# Patient Record
Sex: Female | Born: 1980 | Hispanic: Yes | Marital: Married | Smoking: Former smoker
Health system: Southern US, Community
[De-identification: ages and names within clinical notes are randomized; demographics above are authoritative.]

## PROBLEM LIST (undated history)

## (undated) ENCOUNTER — Inpatient Hospital Stay (HOSPITAL_COMMUNITY): Payer: Self-pay

## (undated) DIAGNOSIS — M26629 Arthralgia of temporomandibular joint, unspecified side: Secondary | ICD-10-CM

## (undated) DIAGNOSIS — K8020 Calculus of gallbladder without cholecystitis without obstruction: Secondary | ICD-10-CM

## (undated) HISTORY — DX: Calculus of gallbladder without cholecystitis without obstruction: K80.20

## (undated) HISTORY — DX: Arthralgia of temporomandibular joint, unspecified side: M26.629

## (undated) HISTORY — PX: PELVIC LAPAROSCOPY: SHX162

---

## 2003-03-16 ENCOUNTER — Emergency Department (HOSPITAL_COMMUNITY): Admission: EM | Admit: 2003-03-16 | Discharge: 2003-03-16 | Payer: Self-pay

## 2003-07-30 ENCOUNTER — Emergency Department (HOSPITAL_COMMUNITY): Admission: EM | Admit: 2003-07-30 | Discharge: 2003-07-31 | Payer: Self-pay | Admitting: Emergency Medicine

## 2003-07-31 ENCOUNTER — Encounter: Payer: Self-pay | Admitting: Emergency Medicine

## 2003-11-09 ENCOUNTER — Other Ambulatory Visit: Admission: RE | Admit: 2003-11-09 | Discharge: 2003-11-09 | Payer: Self-pay | Admitting: Obstetrics and Gynecology

## 2004-09-21 ENCOUNTER — Other Ambulatory Visit: Admission: RE | Admit: 2004-09-21 | Discharge: 2004-09-21 | Payer: Self-pay | Admitting: Obstetrics and Gynecology

## 2006-02-26 ENCOUNTER — Other Ambulatory Visit: Admission: RE | Admit: 2006-02-26 | Discharge: 2006-02-26 | Payer: Self-pay | Admitting: Obstetrics & Gynecology

## 2007-02-05 ENCOUNTER — Other Ambulatory Visit: Admission: RE | Admit: 2007-02-05 | Discharge: 2007-02-05 | Payer: Self-pay | Admitting: Gynecology

## 2007-05-20 ENCOUNTER — Ambulatory Visit (HOSPITAL_COMMUNITY): Admission: RE | Admit: 2007-05-20 | Discharge: 2007-05-20 | Payer: Self-pay | Admitting: Gynecology

## 2010-12-31 ENCOUNTER — Encounter: Payer: Self-pay | Admitting: Gynecology

## 2011-12-24 ENCOUNTER — Encounter (HOSPITAL_COMMUNITY): Payer: Self-pay | Admitting: Emergency Medicine

## 2011-12-24 ENCOUNTER — Emergency Department (HOSPITAL_COMMUNITY)
Admission: EM | Admit: 2011-12-24 | Discharge: 2011-12-25 | Disposition: A | Discharge status: Home / Self Care | Payer: 59 | Attending: Emergency Medicine | Admitting: Emergency Medicine

## 2011-12-24 ENCOUNTER — Emergency Department (HOSPITAL_COMMUNITY)
Admission: EM | Admit: 2011-12-24 | Discharge: 2011-12-24 | Disposition: A | Discharge status: Home / Self Care | Payer: Self-pay | Source: Home / Self Care | Attending: Family Medicine | Admitting: Family Medicine

## 2011-12-24 DIAGNOSIS — R110 Nausea: Secondary | ICD-10-CM | POA: Insufficient documentation

## 2011-12-24 DIAGNOSIS — R1011 Right upper quadrant pain: Secondary | ICD-10-CM

## 2011-12-24 DIAGNOSIS — K8020 Calculus of gallbladder without cholecystitis without obstruction: Secondary | ICD-10-CM | POA: Insufficient documentation

## 2011-12-24 DIAGNOSIS — R109 Unspecified abdominal pain: Secondary | ICD-10-CM | POA: Insufficient documentation

## 2011-12-24 LAB — URINE MICROSCOPIC-ADD ON

## 2011-12-24 LAB — CBC
HCT: 35.6 % — ABNORMAL LOW (ref 36.0–46.0)
MCH: 30.5 pg (ref 26.0–34.0)
MCHC: 35.1 g/dL (ref 30.0–36.0)
RBC: 4.1 MIL/uL (ref 3.87–5.11)
WBC: 8.5 10*3/uL (ref 4.0–10.5)

## 2011-12-24 LAB — URINALYSIS, ROUTINE W REFLEX MICROSCOPIC
Bilirubin Urine: NEGATIVE
Glucose, UA: NEGATIVE mg/dL
Ketones, ur: NEGATIVE mg/dL
Urobilinogen, UA: 0.2 mg/dL (ref 0.0–1.0)

## 2011-12-24 LAB — COMPREHENSIVE METABOLIC PANEL
BUN: 9 mg/dL (ref 6–23)
Calcium: 10 mg/dL (ref 8.4–10.5)
Chloride: 99 mEq/L (ref 96–112)
Creatinine, Ser: 0.56 mg/dL (ref 0.50–1.10)
Sodium: 137 mEq/L (ref 135–145)
Total Bilirubin: 0.2 mg/dL — ABNORMAL LOW (ref 0.3–1.2)

## 2011-12-24 LAB — DIFFERENTIAL
Basophils Absolute: 0 10*3/uL (ref 0.0–0.1)
Basophils Relative: 0 % (ref 0–1)
Eosinophils Absolute: 0.1 10*3/uL (ref 0.0–0.7)
Lymphocytes Relative: 33 % (ref 12–46)
Monocytes Relative: 5 % (ref 3–12)
Neutro Abs: 5.1 10*3/uL (ref 1.7–7.7)

## 2011-12-24 LAB — POCT PREGNANCY, URINE: Preg Test, Ur: NEGATIVE

## 2011-12-24 NOTE — ED Provider Notes (Signed)
History     CSN: [id number]  Arrival date & time 12/24/11  1534   First MD Initiated Contact with Patient 12/24/11 1649      Chief Complaint  Patient presents with  . Abdominal Pain    (Consider location/radiation/quality/duration/timing/severity/associated sxs/prior treatment) HPI Comments: Patient reports RUQ pain that began last night 1 hour after eating.  Associated nausea.  Pain lasted throughout the night, subsided slightly this morning then returned 30 minutes after eating lunch.  Associated nausea.  Patient denies fevers, vomiting, change in bowel habits, urinary symptoms, abnormal vaginal bleeding.  LMP Dec 17.    The history is provided by the patient.    History reviewed. No pertinent past medical history.  History reviewed. No pertinent past surgical history.  No family history on file.  History  Substance Use Topics  . Smoking status: Never Smoker   . Smokeless tobacco: Not on file  . Alcohol Use: No    OB History    Grav Para Term Preterm Abortions TAB SAB Ect Mult Living                  Review of Systems  Constitutional: Negative for fever.  Respiratory: Positive for shortness of breath.   Cardiovascular: Negative for chest pain.  All other systems reviewed and are negative.    Allergies  Review of patient's allergies indicates no known allergies.  Home Medications  No current outpatient prescriptions on file.  BP 129/86  Pulse 99  Temp(Src) 98.3 F (36.8 C) (Oral)  Resp 16  SpO2 98%  Physical Exam  Nursing note and vitals reviewed. Constitutional: She is oriented to person, place, and time. She appears well-developed and well-nourished.  HENT:  Head: Normocephalic and atraumatic.  Neck: Neck supple.  Cardiovascular: Normal rate, regular rhythm and normal heart sounds.   Pulmonary/Chest: Breath sounds normal. No respiratory distress. She has no wheezes. She has no rales. She exhibits no tenderness.  Abdominal: Soft. Bowel sounds are  normal. She exhibits no distension. There is tenderness in the right upper quadrant. There is no rebound and no guarding.       Patient moderately tender in RUQ.    Neurological: She is alert and oriented to person, place, and time.    ED Course  Procedures (including critical care time)  Labs Reviewed - No data to display No results found.   1. Abdominal pain, RUQ       MDM  Patient with RUQ pain, nausea.  No fevers, no vomiting.  Patient with significant tenderness in RUQ.  Likely biliary colic but concern for cholecystitis.  Transfer to ED for further evaluation.         Dillard Cannon [place], [place] 12/24/11 [phone]

## 2011-12-24 NOTE — ED Notes (Signed)
PT. REPORTS RUQ PAIN ONSET LAST NIGHT AFTER EATING , SEEN AT [facility] - SENT HERE FOR FURTHER EVALUATION ,  SLIGHT NAUSEA . NO VOMITTING OR DIARRHEA, SLIGHT CHILLS .

## 2011-12-24 NOTE — ED Provider Notes (Signed)
History     CSN: [id number]  Arrival date & time 12/24/11  1801   First MD Initiated Contact with Patient 12/24/11 2149      Chief Complaint  Patient presents with  . Abdominal Pain    (Consider location/radiation/quality/duration/timing/severity/associated sxs/prior treatment) Patient is a 31 y.o. female presenting with abdominal pain.  Abdominal Pain The primary symptoms of the illness include abdominal pain and nausea.   Complains of epigastric pain radiating to bilateral upper quadrants onset one hour after eating yesterday. Pain lasts approximately an hour with severe resolved without treatment. Pain recurred this afternoon at approximately 1 PM after eating lasagna for lunch. Symptoms are accompanied by nausea no vomiting no fever no other complaint. No other associated symptoms pain described as sharp and severe. No treatment prior to coming here. Seen at [facility] prior to coming here sent here for further evaluation. Patient asymptomatic as I examine her  History reviewed. No pertinent past medical history. Past medical history neck History reviewed. No pertinent past surgical history.  No family history on file. Vertical history negative History  Substance Use Topics  . Smoking status: Never Smoker   . Smokeless tobacco: Not on file  . Alcohol Use: No   no alcohol no illicit drug  OB History    Grav Para Term Preterm Abortions TAB SAB Ect Mult Living                  Review of Systems  Constitutional: Negative.   HENT: Negative.   Respiratory: Negative.   Cardiovascular: Negative.   Gastrointestinal: Positive for nausea and abdominal pain.  Musculoskeletal: Negative.   Skin: Negative.   Neurological: Negative.   Hematological: Negative.   Psychiatric/Behavioral: Negative.     Allergies  Review of patient's allergies indicates no known allergies.  Home Medications  No current outpatient prescriptions on file.  BP 128/90  Pulse 95   Temp(Src) 98.2 F (36.8 C) (Oral)  Resp 16  SpO2 98%  LMP 11/26/2011  Physical Exam  Nursing note and vitals reviewed. Constitutional: She appears well-developed and well-nourished.  HENT:  Head: Normocephalic and atraumatic.  Eyes: Conjunctivae are normal. Pupils are equal, round, and reactive to light.  Neck: Neck supple. No tracheal deviation present. No thyromegaly present.  Cardiovascular: Normal rate and regular rhythm.   No murmur heard. Pulmonary/Chest: Effort normal and breath sounds normal.  Abdominal: Soft. Bowel sounds are normal. She exhibits no distension. There is tenderness.       Tenderness right upper quadrant. Positive Murphy sign  Musculoskeletal: Normal range of motion. She exhibits no edema and no tenderness.  Neurological: She is alert. Coordination normal.  Skin: Skin is warm and dry. No rash noted.  Psychiatric: She has a normal mood and affect.    ED Course  Procedures (including critical care time)  Labs Reviewed  CBC - Abnormal; Notable for the following:    HCT 35.6 (*)    All other components within normal limits  COMPREHENSIVE METABOLIC PANEL - Abnormal; Notable for the following:    AST 99 (*)    ALT 154 (*)    Total Bilirubin 0.2 (*)    All other components within normal limits  URINALYSIS, ROUTINE W REFLEX MICROSCOPIC - Abnormal; Notable for the following:    APPearance CLOUDY (*)    Hgb urine dipstick SMALL (*)    All other components within normal limits  DIFFERENTIAL  POCT PREGNANCY, URINE  POCT PREGNANCY, URINE  URINE MICROSCOPIC-ADD  ON   Results for orders placed during the hospital encounter of 12/24/11  CBC      Component Value Range   WBC 8.5  4.0 - 10.5 (K/uL)   RBC 4.10  3.87 - 5.11 (MIL/uL)   Hemoglobin 12.5  12.0 - 15.0 (g/dL)   HCT 16.1 (*) 09.6 - 46.0 (%)   MCV 86.8  78.0 - 100.0 (fL)   MCH 30.5  26.0 - 34.0 (pg)   MCHC 35.1  30.0 - 36.0 (g/dL)   RDW 04.5  40.9 - 81.1 (%)   Platelets 322  150 - 400 (K/uL)    DIFFERENTIAL      Component Value Range   Neutrophils Relative 60  43 - 77 (%)   Neutro Abs 5.1  1.7 - 7.7 (K/uL)   Lymphocytes Relative 33  12 - 46 (%)   Lymphs Abs 2.8  0.7 - 4.0 (K/uL)   Monocytes Relative 5  3 - 12 (%)   Monocytes Absolute 0.4  0.1 - 1.0 (K/uL)   Eosinophils Relative 1  0 - 5 (%)   Eosinophils Absolute 0.1  0.0 - 0.7 (K/uL)   Basophils Relative 0  0 - 1 (%)   Basophils Absolute 0.0  0.0 - 0.1 (K/uL)  COMPREHENSIVE METABOLIC PANEL      Component Value Range   Sodium 137  135 - 145 (mEq/L)   Potassium 3.7  3.5 - 5.1 (mEq/L)   Chloride 99  96 - 112 (mEq/L)   CO2 27  19 - 32 (mEq/L)   Glucose, Bld 86  70 - 99 (mg/dL)   BUN 9  6 - 23 (mg/dL)   Creatinine, Ser 9.14  0.50 - 1.10 (mg/dL)   Calcium 78.2  8.4 - 10.5 (mg/dL)   Total Protein 7.4  6.0 - 8.3 (g/dL)   Albumin 4.0  3.5 - 5.2 (g/dL)   AST 99 (*) 0 - 37 (U/L)   ALT 154 (*) 0 - 35 (U/L)   Alkaline Phosphatase 84  39 - 117 (U/L)   Total Bilirubin 0.2 (*) 0.3 - 1.2 (mg/dL)   GFR calc non Af Amer >90  >90 (mL/min)   GFR calc Af Amer >90  >90 (mL/min)  URINALYSIS, ROUTINE W REFLEX MICROSCOPIC      Component Value Range   Color, Urine YELLOW  YELLOW    APPearance CLOUDY (*) CLEAR    Specific Gravity, Urine 1.025  1.005 - 1.030    pH 6.0  5.0 - 8.0    Glucose, UA NEGATIVE  NEGATIVE (mg/dL)   Hgb urine dipstick SMALL (*) NEGATIVE    Bilirubin Urine NEGATIVE  NEGATIVE    Ketones, ur NEGATIVE  NEGATIVE (mg/dL)   Protein, ur NEGATIVE  NEGATIVE (mg/dL)   Urobilinogen, UA 0.2  0.0 - 1.0 (mg/dL)   Nitrite NEGATIVE  NEGATIVE    Leukocytes, UA NEGATIVE  NEGATIVE   POCT PREGNANCY, URINE      Component Value Range   Preg Test, Ur NEGATIVE    URINE MICROSCOPIC-ADD ON      Component Value Range   Squamous Epithelial / LPF FEW (*) RARE    RBC / HPF 3-6  <3 (RBC/hpf)   Bacteria, UA FEW (*) RARE    Casts GRANULAR CAST (*) NEGATIVE   LIPASE, BLOOD      Component Value Range   Lipase 24  11 - 59 (U/L)   No  results found.  No results found.   No diagnosis found.  MDM  Patient signed out to Dr. Brooke Dare at 12 midnight To check abdominal ultrasound Suspect biliary colic or acute cholecystitis  Diagnosis :#1 abdominal pain #2 elevated lfts     Doug Sou, MD 12/25/11 [phone]

## 2011-12-24 NOTE — ED Notes (Signed)
PT HERE  WITH SUDDEN RIGHT UPPER QUAD PAIN POST EATING DINNER YESTERDAY THAT RESOLVED THEN REPEATED TODAY WITH SEVERE SHARP PAIN,NAUSEA.NO TREATMENTS TRIED.VSS

## 2011-12-24 NOTE — ED Provider Notes (Signed)
Medical screening examination/treatment/procedure(s) were performed by non-physician practitioner and as supervising physician I was immediately available for consultation/collaboration.   Lequan Dobratz DOUGLAS MD.    Rueben Kassim Douglas Jahleel Stroschein, MD 12/24/11 2102 

## 2011-12-25 ENCOUNTER — Emergency Department (HOSPITAL_COMMUNITY): Payer: 59

## 2011-12-25 MED ORDER — PROMETHAZINE HCL 25 MG PO TABS: 25.0000 mg | ORAL_TABLET | Freq: Four times a day (QID) | ORAL | Status: AC | PRN

## 2011-12-25 MED ORDER — OXYCODONE-ACETAMINOPHEN 5-325 MG PO TABS: 1.0000 | ORAL_TABLET | Freq: Four times a day (QID) | ORAL | Status: AC | PRN

## 2011-12-25 NOTE — ED Provider Notes (Signed)
Symptomatic cholelithiasis with no evidence of cholecystitis. Discussed with surgery. Instructed patient to followup in one week.  Dayton Bailiff, MD 12/25/11 [phone]

## 2012-01-08 ENCOUNTER — Encounter (INDEPENDENT_AMBULATORY_CARE_PROVIDER_SITE_OTHER): Payer: Self-pay | Admitting: Surgery

## 2012-01-08 ENCOUNTER — Ambulatory Visit (INDEPENDENT_AMBULATORY_CARE_PROVIDER_SITE_OTHER): Payer: 59 | Admitting: Surgery

## 2012-01-08 VITALS — BP 122/76 | HR 72 | Temp 97.4°F | Resp 18 | Ht 63.0 in | Wt 195.4 lb

## 2012-01-08 DIAGNOSIS — K8010 Calculus of gallbladder with chronic cholecystitis without obstruction: Secondary | ICD-10-CM | POA: Insufficient documentation

## 2012-01-08 NOTE — Progress Notes (Signed)
[name], female   DOB: 1981-10-01, 31 y.o.   MRN: [id number]  Chief Complaint  Patient presents with  . Other    Eval gallstones    HPI Becky Everett is a 31 y.o. female. Referred by Dr. Brooke Dare in ED for symptomatic gallstones    HPI 31 yo female presents with two recent episodes of severe post-prandial RUQ abdominal pain and nausea.  She continues to have some mild RUQ discomfort.  She denies any diarrhea, vomiting, or acholic stool.  She was evaluated in the emergency department on 1/14 and was diagnosed with gallstones, but no cholecystitis.  LFT's WNL.   Past Medical History  Diagnosis Date  . Gallstones   . Abdominal pain     right side per patient  . Migraines     History reviewed. No pertinent past surgical history.  Family History  Problem Relation Age of Onset  . Stroke Mother   . Seizures Mother   . Hypertension Mother   . Diabetes Father   . Heart disease Father     has had two heart attacks    Social History History  Substance Use Topics  . Smoking status: Former Smoker    Quit date: 01/08/1992  . Smokeless tobacco: Never Used  . Alcohol Use: No    No Known Allergies  No current outpatient prescriptions on file.    Review of Systems Review of Systems  Constitutional: Negative for fever, chills and unexpected weight change.  HENT: Negative for hearing loss, congestion, sore throat, trouble swallowing and voice change.   Eyes: Negative for visual disturbance.  Respiratory: Negative for cough and wheezing.   Cardiovascular: Negative for chest pain, palpitations and leg swelling.  Gastrointestinal: Positive for nausea and abdominal pain. Negative for vomiting, diarrhea, constipation, blood in stool, abdominal distention and anal bleeding.  Genitourinary: Negative for hematuria, vaginal bleeding and difficulty urinating.  Musculoskeletal: Negative for arthralgias.  Skin: Negative for rash and wound.  Neurological:  Negative for seizures, syncope and headaches.  Hematological: Negative for adenopathy. Does not bruise/bleed easily.  Psychiatric/Behavioral: Negative for confusion.    Blood pressure 122/76, pulse 72, temperature 97.4 F (36.3 C), temperature source Temporal, resp. rate 18, height 5\' 3"  (1.6 m), weight 195 lb 6.4 oz (88.633 kg), last menstrual period 11/26/2011.  Physical Exam Physical Exam WDWN in NAD HEENT:  EOMI, sclera anicteric Neck:  No masses, no thyromegaly Lungs:  CTA bilaterally; normal respiratory effort CV:  Regular rate and rhythm; no murmurs Abd:  +bowel sounds, soft, mildly tender in RUQ, no masses Ext:  Well-perfused; no edema Skin:  Warm, dry; no sign of jaundice  Data Reviewed Ultrasound - cholelithiasis, no cholecystitis Assessment    Chronic calculus cholecystitis    Plan    Laparoscopic cholecystectomy with intraoperative cholangiogram.  The surgical procedure has been discussed with the patient.  Potential risks, benefits, alternative treatments, and expected outcomes have been explained.  All of the patient's questions at this time have been answered.  The likelihood of reaching the patient's treatment goal is good.  The patient understand the proposed surgical procedure and wishes to proceed.        Becky Everett K. 01/08/2012, 3:50 PM

## 2012-02-22 ENCOUNTER — Encounter (HOSPITAL_COMMUNITY): Payer: Self-pay | Admitting: Pharmacy Technician

## 2012-02-26 ENCOUNTER — Encounter (HOSPITAL_COMMUNITY)
Admission: RE | Admit: 2012-02-26 | Discharge: 2012-02-26 | Disposition: A | Discharge status: Home / Self Care | Payer: 59 | Source: Ambulatory Visit | Attending: Surgery | Admitting: Surgery

## 2012-02-26 ENCOUNTER — Encounter (HOSPITAL_COMMUNITY): Payer: Self-pay

## 2012-02-26 NOTE — Patient Instructions (Addendum)
18 Becky Everett  02/26/2012   Your procedure is scheduled on: 3-27 -2013  Report to [facility] at  0800      AM.  Call this number if you have problems the morning of surgery: [phone]   Remember:   Do not eat food:After Midnight.    Take these medicines the morning of surgery with A SIP OF WATER: none   Do not wear jewelry, make-up or nail polish.  Do not wear lotions, powders, or perfumes. You may wear deodorant.  Do not shave 48 hours prior to surgery.(face and neck okay, no shaving of legs)  Do not bring valuables to the hospital.  Contacts, dentures or bridgework may not be worn into surgery.  Leave suitcase in the car. After surgery it may be brought to your room.  For patients admitted to the hospital, checkout time is 11:00 AM the day of discharge.   Patients discharged the day of surgery will not be allowed to drive home.  Name and phone number of your driver: Becky Everett, boyfriend [phone] cell  Special Instructions: CHG Shower Use Special Wash: 1/2 bottle night before surgery and 1/2 bottle morning of surgery.(avoid face and genitals)   Please read over the following fact sheets that you were given: MRSA Information.

## 2012-03-04 NOTE — H&P (Signed)
[name], female   DOB: [birth year], 31 y.o.   MRN: [id number]    Chief Complaint   Patient presents with   .  Other       Eval gallstones        HPI Becky Everett is a 31 y.o. female. Referred by Dr. Brooke Dare in ED for symptomatic gallstones      HPI 31 yo female presents with two recent episodes of severe post-prandial RUQ abdominal pain and nausea.  She continues to have some mild RUQ discomfort.  She denies any diarrhea, vomiting, or acholic stool.  She was evaluated in the emergency department on 1/14 and was diagnosed with gallstones, but no cholecystitis.  LFT's WNL.      Past Medical History   Diagnosis  Date   .  Gallstones     .  Abdominal pain         right side per patient   .  Migraines          History reviewed. No pertinent past surgical history.    Family History   Problem  Relation  Age of Onset   .  Stroke  Mother     .  Seizures  Mother     .  Hypertension  Mother     .  Diabetes  Father     .  Heart disease  Father         has had two heart attacks        Social History History   Substance Use Topics   .  Smoking status:  Former Smoker       Quit date:  01/08/1992   .  Smokeless tobacco:  Never Used   .  Alcohol Use:  No        No Known Allergies    No current outpatient prescriptions on file.        Review of Systems Review of Systems  Constitutional: Negative for fever, chills and unexpected weight change.  HENT: Negative for hearing loss, congestion, sore throat, trouble swallowing and voice change.   Eyes: Negative for visual disturbance.  Respiratory: Negative for cough and wheezing.   Cardiovascular: Negative for chest pain, palpitations and leg swelling.  Gastrointestinal: Positive for nausea and abdominal pain. Negative for vomiting, diarrhea, constipation, blood in stool, abdominal distention and anal bleeding.  Genitourinary: Negative for hematuria, vaginal bleeding and  difficulty urinating.  Musculoskeletal: Negative for arthralgias.  Skin: Negative for rash and wound.  Neurological: Negative for seizures, syncope and headaches.  Hematological: Negative for adenopathy. Does not bruise/bleed easily.  Psychiatric/Behavioral: Negative for confusion.      Blood pressure 122/76, pulse 72, temperature 97.4 F (36.3 C), temperature source Temporal, resp. rate 18, height 5\' 3"  (1.6 m), weight 195 lb 6.4 oz (88.633 kg), last menstrual period 11/26/2011.   Physical Exam Physical Exam WDWN in NAD HEENT:  EOMI, sclera anicteric Neck:  No masses, no thyromegaly Lungs:  CTA bilaterally; normal respiratory effort CV:  Regular rate and rhythm; no murmurs Abd:  +bowel sounds, soft, mildly tender in RUQ, no masses Ext:  Well-perfused; no edema Skin:  Warm, dry; no sign of jaundice   Data Reviewed Ultrasound - cholelithiasis, no cholecystitis Assessment    Chronic calculus cholecystitis     Plan    Laparoscopic cholecystectomy with intraoperative cholangiogram.  The surgical procedure has been discussed with the patient.  Potential risks, benefits, alternative treatments, and expected outcomes have  been explained.  All of the patient's questions at this time have been answered.  The likelihood of reaching the patient's treatment goal is good.  The patient understand the proposed surgical procedure and wishes to proceed.           Wilmon Arms. Corliss Skains, MD, [facility]  03/04/2012 9:43 PM

## 2012-03-05 ENCOUNTER — Encounter (HOSPITAL_COMMUNITY): Payer: Self-pay | Admitting: Anesthesiology

## 2012-03-05 ENCOUNTER — Ambulatory Visit (HOSPITAL_COMMUNITY): Payer: 59 | Admitting: Anesthesiology

## 2012-03-05 ENCOUNTER — Ambulatory Visit (HOSPITAL_COMMUNITY): Payer: 59

## 2012-03-05 ENCOUNTER — Encounter (HOSPITAL_COMMUNITY): Payer: Self-pay | Admitting: *Deleted

## 2012-03-05 ENCOUNTER — Encounter (HOSPITAL_COMMUNITY)
Admission: RE | Disposition: A | Discharge status: Home / Self Care | Payer: Self-pay | Source: Ambulatory Visit | Attending: Surgery

## 2012-03-05 ENCOUNTER — Ambulatory Visit (HOSPITAL_COMMUNITY)
Admission: RE | Admit: 2012-03-05 | Discharge: 2012-03-05 | Disposition: A | Discharge status: Home / Self Care | Payer: 59 | Source: Ambulatory Visit | Attending: Surgery | Admitting: Surgery

## 2012-03-05 DIAGNOSIS — K8010 Calculus of gallbladder with chronic cholecystitis without obstruction: Secondary | ICD-10-CM

## 2012-03-05 DIAGNOSIS — K824 Cholesterolosis of gallbladder: Secondary | ICD-10-CM

## 2012-03-05 DIAGNOSIS — Z01812 Encounter for preprocedural laboratory examination: Secondary | ICD-10-CM | POA: Insufficient documentation

## 2012-03-05 DIAGNOSIS — G43909 Migraine, unspecified, not intractable, without status migrainosus: Secondary | ICD-10-CM | POA: Insufficient documentation

## 2012-03-05 DIAGNOSIS — K811 Chronic cholecystitis: Secondary | ICD-10-CM

## 2012-03-05 DIAGNOSIS — R42 Dizziness and giddiness: Secondary | ICD-10-CM | POA: Insufficient documentation

## 2012-03-05 HISTORY — PX: CHOLECYSTECTOMY: SHX55

## 2012-03-05 SURGERY — LAPAROSCOPIC CHOLECYSTECTOMY WITH INTRAOPERATIVE CHOLANGIOGRAM
Anesthesia: General | Wound class: Clean Contaminated

## 2012-03-05 MED ORDER — MIDAZOLAM HCL 5 MG/5ML IJ SOLN
INTRAMUSCULAR | Status: DC | PRN
  Administered 2012-03-05: 2 mg via INTRAVENOUS

## 2012-03-05 MED ORDER — ESMOLOL HCL 10 MG/ML IV SOLN
INTRAVENOUS | Status: DC | PRN
  Administered 2012-03-05: 25 mg via INTRAVENOUS

## 2012-03-05 MED ORDER — SODIUM CHLORIDE 0.9 % IJ SOLN
INTRAMUSCULAR | Status: DC | PRN
  Administered 2012-03-05: 11:00:00

## 2012-03-05 MED ORDER — PROPOFOL 10 MG/ML IV EMUL
INTRAVENOUS | Status: DC | PRN
  Administered 2012-03-05: 140 mg via INTRAVENOUS

## 2012-03-05 MED ORDER — CEFAZOLIN SODIUM 1-5 GM-% IV SOLN
INTRAVENOUS | Status: AC
  Filled 2012-03-05: qty 100

## 2012-03-05 MED ORDER — DEXAMETHASONE SODIUM PHOSPHATE 10 MG/ML IJ SOLN
INTRAMUSCULAR | Status: DC | PRN
  Administered 2012-03-05: 10 mg via INTRAVENOUS

## 2012-03-05 MED ORDER — NEOSTIGMINE METHYLSULFATE 1 MG/ML IJ SOLN
INTRAMUSCULAR | Status: DC | PRN
  Administered 2012-03-05: 4 mg via INTRAVENOUS

## 2012-03-05 MED ORDER — GLYCOPYRROLATE 0.2 MG/ML IJ SOLN
INTRAMUSCULAR | Status: DC | PRN
  Administered 2012-03-05: .6 mg via INTRAVENOUS

## 2012-03-05 MED ORDER — FENTANYL CITRATE 0.05 MG/ML IJ SOLN
INTRAMUSCULAR | Status: DC | PRN
  Administered 2012-03-05: 50 ug via INTRAVENOUS
  Administered 2012-03-05: 100 ug via INTRAVENOUS
  Administered 2012-03-05: 50 ug via INTRAVENOUS

## 2012-03-05 MED ORDER — CEFAZOLIN SODIUM-DEXTROSE 2-3 GM-% IV SOLR
2.0000 g | INTRAVENOUS | Status: AC
  Administered 2012-03-05: 2 g via INTRAVENOUS

## 2012-03-05 MED ORDER — OXYCODONE-ACETAMINOPHEN 5-325 MG PO TABS
1.0000 | ORAL_TABLET | ORAL | Status: DC | PRN
  Administered 2012-03-05: 1 via ORAL

## 2012-03-05 MED ORDER — OXYCODONE-ACETAMINOPHEN 5-325 MG PO TABS: 1.0000 | ORAL_TABLET | ORAL | Status: AC | PRN

## 2012-03-05 MED ORDER — HYDROMORPHONE HCL PF 1 MG/ML IJ SOLN
INTRAMUSCULAR | Status: AC
  Filled 2012-03-05: qty 1

## 2012-03-05 MED ORDER — MORPHINE SULFATE 10 MG/ML IJ SOLN: 2.0000 mg | INTRAMUSCULAR | Status: DC | PRN

## 2012-03-05 MED ORDER — BUPIVACAINE-EPINEPHRINE 0.25% -1:200000 IJ SOLN
INTRAMUSCULAR | Status: DC | PRN
  Administered 2012-03-05: 15 mL

## 2012-03-05 MED ORDER — HYDROMORPHONE HCL PF 1 MG/ML IJ SOLN
0.2500 mg | INTRAMUSCULAR | Status: DC | PRN
  Administered 2012-03-05: 0.25 mg via INTRAVENOUS

## 2012-03-05 MED ORDER — ONDANSETRON HCL 4 MG/2ML IJ SOLN
INTRAMUSCULAR | Status: DC | PRN
  Administered 2012-03-05: 4 mg via INTRAVENOUS

## 2012-03-05 MED ORDER — DROPERIDOL 2.5 MG/ML IJ SOLN
INTRAMUSCULAR | Status: DC | PRN
  Administered 2012-03-05: 0.625 mg via INTRAVENOUS

## 2012-03-05 MED ORDER — ROCURONIUM BROMIDE 100 MG/10ML IV SOLN
INTRAVENOUS | Status: DC | PRN
  Administered 2012-03-05: 50 mg via INTRAVENOUS

## 2012-03-05 MED ORDER — IOHEXOL 300 MG/ML  SOLN
INTRAMUSCULAR | Status: AC
  Filled 2012-03-05: qty 1

## 2012-03-05 MED ORDER — ONDANSETRON HCL 4 MG/2ML IJ SOLN: 4.0000 mg | INTRAMUSCULAR | Status: DC | PRN

## 2012-03-05 MED ORDER — LACTATED RINGERS IV SOLN
INTRAVENOUS | Status: DC
  Administered 2012-03-05: 11:00:00 via INTRAVENOUS
  Administered 2012-03-05: 1000 mL via INTRAVENOUS
  Administered 2012-03-05: 12:00:00 via INTRAVENOUS

## 2012-03-05 MED ORDER — BUPIVACAINE-EPINEPHRINE PF 0.25-1:200000 % IJ SOLN
INTRAMUSCULAR | Status: AC
  Filled 2012-03-05: qty 30

## 2012-03-05 MED ORDER — LACTATED RINGERS IR SOLN
Status: DC | PRN
  Administered 2012-03-05: 1

## 2012-03-05 MED ORDER — OXYCODONE-ACETAMINOPHEN 5-325 MG PO TABS
ORAL_TABLET | ORAL | Status: AC
  Filled 2012-03-05: qty 1

## 2012-03-05 MED ORDER — 0.9 % SODIUM CHLORIDE (POUR BTL) OPTIME
TOPICAL | Status: DC | PRN
  Administered 2012-03-05: 1000 mL

## 2012-03-05 SURGICAL SUPPLY — 41 items
APL SKNCLS STERI-STRIP NONHPOA (GAUZE/BANDAGES/DRESSINGS) ×1
APPLIER CLIP ROT 10 11.4 M/L (STAPLE) ×2
APR CLP MED LRG 11.4X10 (STAPLE) ×1
BAG SPEC RTRVL LRG 6X4 10 (ENDOMECHANICALS) ×1
BENZOIN TINCTURE PRP APPL 2/3 (GAUZE/BANDAGES/DRESSINGS) ×2 IMPLANT
CANISTER SUCTION 2500CC (MISCELLANEOUS) ×2 IMPLANT
CHLORAPREP W/TINT 26ML (MISCELLANEOUS) ×2 IMPLANT
CLIP APPLIE ROT 10 11.4 M/L (STAPLE) ×1 IMPLANT
CLOTH BEACON ORANGE TIMEOUT ST (SAFETY) ×2 IMPLANT
COVER MAYO STAND STRL (DRAPES) ×2 IMPLANT
DECANTER SPIKE VIAL GLASS SM (MISCELLANEOUS) ×1 IMPLANT
DRAPE C-ARM 42X72 X-RAY (DRAPES) ×2 IMPLANT
DRAPE LAPAROSCOPIC ABDOMINAL (DRAPES) ×2 IMPLANT
DRAPE UTILITY XL STRL (DRAPES) ×2 IMPLANT
DRSG TEGADERM 2-3/8X2-3/4 SM (GAUZE/BANDAGES/DRESSINGS) ×6 IMPLANT
DRSG TEGADERM 4X4.75 (GAUZE/BANDAGES/DRESSINGS) ×2 IMPLANT
ELECT REM PT RETURN 9FT ADLT (ELECTROSURGICAL) ×2
ELECTRODE REM PT RTRN 9FT ADLT (ELECTROSURGICAL) ×1 IMPLANT
FILTER SMOKE EVAC LAPAROSHD (FILTER) ×2 IMPLANT
GLOVE BIO SURGEON STRL SZ7 (GLOVE) ×2 IMPLANT
GLOVE BIOGEL PI IND STRL 7.0 (GLOVE) ×1 IMPLANT
GLOVE BIOGEL PI IND STRL 7.5 (GLOVE) ×1 IMPLANT
GLOVE BIOGEL PI INDICATOR 7.0 (GLOVE) ×1
GLOVE BIOGEL PI INDICATOR 7.5 (GLOVE) ×1
GOWN STRL NON-REIN LRG LVL3 (GOWN DISPOSABLE) ×5 IMPLANT
GOWN STRL REIN XL XLG (GOWN DISPOSABLE) ×2 IMPLANT
KIT BASIN OR (CUSTOM PROCEDURE TRAY) ×2 IMPLANT
NS IRRIG 1000ML POUR BTL (IV SOLUTION) ×2 IMPLANT
POUCH SPECIMEN RETRIEVAL 10MM (ENDOMECHANICALS) ×2 IMPLANT
RINGERS IRRIG 1000ML POUR BTL (IV SOLUTION) ×2 IMPLANT
SET CHOLANGIOGRAPH MIX (MISCELLANEOUS) ×2 IMPLANT
SET IRRIG TUBING LAPAROSCOPIC (IRRIGATION / IRRIGATOR) ×2 IMPLANT
SOLUTION ANTI FOG 6CC (MISCELLANEOUS) ×2 IMPLANT
STRIP CLOSURE SKIN 1/2X4 (GAUZE/BANDAGES/DRESSINGS) ×2 IMPLANT
SUT MNCRL AB 4-0 PS2 18 (SUTURE) ×2 IMPLANT
TOWEL OR 17X26 10 PK STRL BLUE (TOWEL DISPOSABLE) ×4 IMPLANT
TRAY LAP CHOLE (CUSTOM PROCEDURE TRAY) ×2 IMPLANT
TROCAR BLADELESS OPT 5 75 (ENDOMECHANICALS) ×4 IMPLANT
TROCAR XCEL BLUNT TIP 100MML (ENDOMECHANICALS) ×2 IMPLANT
TROCAR XCEL NON-BLD 11X100MML (ENDOMECHANICALS) ×2 IMPLANT
TUBING INSUFFLATION 10FT LAP (TUBING) ×2 IMPLANT

## 2012-03-05 NOTE — Discharge Instructions (Signed)
[facility] °LAPAROSCOPIC SURGERY: POST OP INSTRUCTIONS °Always review your discharge instruction sheet given to you by the facility where your surgery was performed. °IF YOU HAVE DISABILITY OR FAMILY LEAVE FORMS, YOU MUST BRING THEM TO THE OFFICE FOR PROCESSING.   °DO NOT GIVE THEM TO YOUR DOCTOR. ° °1. A prescription for pain medication will be given to you upon discharge.  Take your pain medication as prescribed, if needed.  If narcotic pain medicine is not needed, then you may take acetaminophen (Tylenol) or ibuprofen (Advil) as needed. °2. Take your usually prescribed medications unless otherwise directed. °3. If you need a refill on your pain medication, please contact your pharmacy.  They will contact our office to request authorization. Prescriptions will not be filled after 5pm or on week-ends. °4. You should follow a light diet the first few days after arrival home, such as soup and crackers, etc.  Be sure to include lots of fluids daily. °5. Most patients will experience some swelling and bruising in the area of the incisions.  Ice packs will help.  Swelling and bruising can take several days to resolve.  °6. It is common to experience some constipation if taking pain medication after surgery.  Increasing fluid intake and taking a stool softener (such as Colace) will usually help or prevent this problem from occurring.  A mild laxative (Milk of Magnesia or Miralax) should be taken according to package instructions if there are no bowel movements after 48 hours. °7. Unless discharge instructions indicate otherwise, you may remove your bandages 48 hours after surgery, and you may shower at that time.  You will have steri-strips (small skin tapes) in place directly over the incision.  These strips should be left on the skin for 7-10 days.  °8. ACTIVITIES:  You may resume regular (light) daily activities beginning the next day--such as daily self-care, walking, climbing stairs--gradually  increasing activities as tolerated.  You may have sexual intercourse when it is comfortable.  Refrain from any heavy lifting or straining until approved by your doctor. °a. You may drive when you are no longer taking prescription pain medication, you can comfortably wear a seatbelt, and you can safely maneuver your car and apply brakes. °b. RETURN TO WORK:   2-3 weeks °9. You should see your doctor in the office for a follow-up appointment approximately 2-3 weeks after your surgery.  Make sure that you call for this appointment within a day or two after you arrive home to insure a convenient appointment time. °10. OTHER INSTRUCTIONS: ________________________________________________________________________ °WHEN TO CALL YOUR DOCTOR: °1. Fever over 101.0 °2. Inability to urinate °3. Continued bleeding from incision. °4. Increased pain, redness, or drainage from the incision. °5. Increasing abdominal pain ° °The clinic staff is available to answer your questions during regular business hours.  Please don’t hesitate to call and ask to speak to one of the nurses for clinical concerns.  If you have a medical emergency, go to the nearest emergency room or call 911.  A surgeon from [facility] is always on call at the hospital. °[address], [address], [place], [place]  [address] ? [address] [address], [place],    [address] °[phone] ? [phone] ? FAX [phone] °Web site: [email or web address] ° °

## 2012-03-05 NOTE — Anesthesia Preprocedure Evaluation (Signed)
Anesthesia Evaluation  Patient identified by MRN, date of birth, ID band Patient awake    Reviewed: Allergy & Precautions, H&P , NPO status , Patient's Chart, lab work & pertinent test results, reviewed documented beta blocker date and time   Airway Mallampati: II      Dental  (+) Teeth Intact and Dental Advisory Given   Pulmonary neg pulmonary ROS,  breath sounds clear to auscultation        Cardiovascular Rhythm:Regular Rate:Normal  Denies cardiac symptoms   Neuro/Psych negative neurological ROS  negative psych ROS   GI/Hepatic Neg liver ROS, gallstones   Endo/Other  negative endocrine ROS  Renal/GU negative Renal ROS  negative genitourinary   Musculoskeletal negative musculoskeletal ROS (+)   Abdominal   Peds negative pediatric ROS (+)  Hematology negative hematology ROS (+)   Anesthesia Other Findings   Reproductive/Obstetrics negative OB ROS                           Anesthesia Physical Anesthesia Plan  ASA: II  Anesthesia Plan: General   Post-op Pain Management:    Induction: Intravenous  Airway Management Planned: Oral ETT  Additional Equipment:   Intra-op Plan:   Post-operative Plan: Extubation in OR  Informed Consent: I have reviewed the patients History and Physical, chart, labs and discussed the procedure including the risks, benefits and alternatives for the proposed anesthesia with the patient or authorized representative who has indicated his/her understanding and acceptance.   Dental advisory given  Plan Discussed with: CRNA and Surgeon  Anesthesia Plan Comments:         Anesthesia Quick Evaluation

## 2012-03-05 NOTE — Interval H&P Note (Signed)
History and Physical Interval Note:  03/05/2012 8:37 AM  Becky Everett  has presented today for surgery, with the diagnosis of chronic calculus cholelithiasis  The various methods of treatment have been discussed with the patient and family. After consideration of risks, benefits and other options for treatment, the patient has consented to  Procedure(s) (LRB): LAPAROSCOPIC CHOLECYSTECTOMY WITH INTRAOPERATIVE CHOLANGIOGRAM (N/A) as a surgical intervention .  The patients' history has been reviewed, patient examined, no change in status, stable for surgery.  I have reviewed the patients' chart and labs.  Questions were answered to the patient's satisfaction.     [name] K.

## 2012-03-05 NOTE — Progress Notes (Signed)
Pt sat on side of bed then stood for approx. 3 minutes.   Pt states she was "pretty dizzy" and didn't feel up to walking just yet.  Pt did belch a few times relieving some abd pressure.  Pt is lying back down and Fiance is at bedside.

## 2012-03-05 NOTE — Op Note (Signed)
Laparoscopic Cholecystectomy with IOC Procedure Note  Indications: This patient presents with symptomatic gallbladder disease and will undergo laparoscopic cholecystectomy.  Pre-operative Diagnosis: Calculus of gallbladder with other cholecystitis, without mention of obstruction  Post-operative Diagnosis: Same  Surgeon: Amirah Goerke K.   Assistants: none  Anesthesia: General endotracheal anesthesia  ASA Class: 1  Procedure Details  The patient was seen again in the Holding Room. The risks, benefits, complications, treatment options, and expected outcomes were discussed with the patient. The possibilities of reaction to medication, pulmonary aspiration, perforation of viscus, bleeding, recurrent infection, finding a normal gallbladder, the need for additional procedures, failure to diagnose a condition, the possible need to convert to an open procedure, and creating a complication requiring transfusion or operation were discussed with the patient. The likelihood of improving the patient's symptoms with return to their baseline status is good.  The patient and/or family concurred with the proposed plan, giving informed consent. The site of surgery properly noted. The patient was taken to Operating Room, identified as Becky Everett and the procedure verified as Laparoscopic Cholecystectomy with Intraoperative Cholangiogram. A Time Out was held and the above information confirmed.  Prior to the induction of general anesthesia, antibiotic prophylaxis was administered. General endotracheal anesthesia was then administered and tolerated well. After the induction, the abdomen was prepped with Chloraprep and draped in the sterile fashion. The patient was positioned in the supine position.  Local anesthetic agent was injected into the skin near the umbilicus and an incision made. We dissected down to the abdominal fascia with blunt dissection.  The fascia was incised vertically and we entered  the peritoneal cavity bluntly.  A pursestring suture of 0-Vicryl was placed around the fascial opening.  The Hasson cannula was inserted and secured with the stay suture.  Pneumoperitoneum was then created with CO2 and tolerated well without any adverse changes in the patient's vital signs. An 11-mm port was placed in the subxiphoid position.  Two 5-mm ports were placed in the right upper quadrant. All skin incisions were infiltrated with a local anesthetic agent before making the incision and placing the trocars.   We positioned the patient in reverse Trendelenburg, tilted slightly to the patient's left.  The gallbladder was identified, the fundus grasped and retracted cephalad. Adhesions were lysed bluntly and with the electrocautery where indicated, taking care not to injure any adjacent organs or viscus. The infundibulum was grasped and retracted laterally, exposing the peritoneum overlying the triangle of Calot. This was then divided and exposed in a blunt fashion. A critical view of the cystic duct and cystic artery was obtained.  The cystic duct was clearly identified and bluntly dissected circumferentially. The cystic duct was ligated with a clip distally.   An incision was made in the cystic duct and the [facility] cholangiogram catheter introduced. The catheter was secured using a clip. A cholangiogram was then obtained which showed good visualization of the distal and proximal biliary tree with no sign of filling defects or obstruction.  Contrast flowed easily into the duodenum. The catheter was then removed.   The cystic duct was then ligated with clips and divided. The cystic artery was identified, dissected free, ligated with clips and divided as well.   The gallbladder was dissected from the liver bed in retrograde fashion with the electrocautery. The gallbladder was removed and placed in an Endocatch sac. The liver bed was irrigated and inspected. Hemostasis was achieved with the electrocautery.  Copious irrigation was utilized and was repeatedly aspirated until clear.  The gallbladder and Endocatch sac were then removed through the umbilical port site.  The pursestring suture was used to close the umbilical fascia.    We again inspected the right upper quadrant for hemostasis.  Pneumoperitoneum was released as we removed the trocars.  4-0 Monocryl was used to close the skin.   Benzoin, steri-strips, and clean dressings were applied. The patient was then extubated and brought to the recovery room in stable condition. Instrument, sponge, and needle counts were correct at closure and at the conclusion of the case.   Findings: Cholecystitis with Cholelithiasis  Estimated Blood Loss: Minimal         Drains: none         Specimens: Gallbladder           Complications: None; patient tolerated the procedure well.         Disposition: PACU - hemodynamically stable.         Condition: stable

## 2012-03-05 NOTE — Anesthesia Postprocedure Evaluation (Signed)
  Anesthesia Post-op Note  Patient: Becky Everett  Procedure(s) Performed: Procedure(s) (LRB): LAPAROSCOPIC CHOLECYSTECTOMY WITH INTRAOPERATIVE CHOLANGIOGRAM (N/A)  Patient Location: PACU  Anesthesia Type: General  Level of Consciousness: oriented and sedated  Airway and Oxygen Therapy: Patient Spontanous Breathing  Post-op Pain: mild  Post-op Assessment: Post-op Vital signs reviewed, Patient's Cardiovascular Status Stable, Respiratory Function Stable and Patent Airway  Post-op Vital Signs: stable  Complications: No apparent anesthesia complications

## 2012-03-05 NOTE — Anesthesia Postprocedure Evaluation (Signed)
  Anesthesia Post-op Note  Patient: Becky Everett  Procedure(s) Performed: Procedure(s) (LRB): LAPAROSCOPIC CHOLECYSTECTOMY WITH INTRAOPERATIVE CHOLANGIOGRAM (N/A)  Patient Location: PACU  Anesthesia Type: General  Level of Consciousness: oriented and sedated  Airway and Oxygen Therapy: Patient Spontanous Breathing  Post-op Pain: mild  Post-op Assessment: Post-op Vital signs reviewed, Patient's Cardiovascular Status Stable, Respiratory Function Stable and Patent Airway  Post-op Vital Signs: stable  Complications: No apparent anesthesia complications 

## 2012-03-05 NOTE — Transfer of Care (Signed)
Immediate Anesthesia Transfer of Care Note  Patient: Becky Everett  Procedure(s) Performed: Procedure(s) (LRB): LAPAROSCOPIC CHOLECYSTECTOMY WITH INTRAOPERATIVE CHOLANGIOGRAM (N/A)  Patient Location: PACU  Anesthesia Type: General  Level of Consciousness: awake, sedated and patient cooperative  Airway & Oxygen Therapy: Patient Spontanous Breathing and Patient connected to face mask oxygen  Post-op Assessment: Report given to PACU RN and Post -op Vital signs reviewed and stable  Post vital signs: Reviewed and stable  Complications: No apparent anesthesia complications

## 2012-03-07 ENCOUNTER — Encounter (HOSPITAL_COMMUNITY): Payer: Self-pay | Admitting: Surgery

## 2012-03-18 ENCOUNTER — Encounter (INDEPENDENT_AMBULATORY_CARE_PROVIDER_SITE_OTHER): Payer: Self-pay | Admitting: Surgery

## 2012-03-18 ENCOUNTER — Ambulatory Visit (INDEPENDENT_AMBULATORY_CARE_PROVIDER_SITE_OTHER): Payer: 59 | Admitting: Surgery

## 2012-03-18 VITALS — BP 118/66 | HR 68 | Temp 97.4°F | Resp 16 | Ht 63.0 in | Wt 200.0 lb

## 2012-03-18 DIAGNOSIS — K8010 Calculus of gallbladder with chronic cholecystitis without obstruction: Secondary | ICD-10-CM

## 2012-03-18 NOTE — Patient Instructions (Signed)
Use a daily fiber supplement to help with your diarrhea.  Call us back if the diarrhea does not improve in a couple of weeks.  [phone] ask for Pattricia Boss, my nurse

## 2012-03-18 NOTE — Progress Notes (Signed)
S/p laparoscopic cholecystectomy with IOC on 03/05/12 for chronic calculus cholecystitis.  The patient is doing well  Appetite is good.  She is having some post-prandial diarrhea, which varies with her diet.  She denies any fever.  No abdominal pain.  Filed Vitals:   03/18/12 1531  BP: 118/66  Pulse: 68  Temp: 97.4 F (36.3 C)  Resp: 16   Her incisions are healing well with no sign of infection.  No abdominal tenderness.  Recs:  Fiber supplement to help with her bowel movements.  May try a half dose of Imodium each day.  The post-prandial diarrhea should resolve with time.  Follow-up PRN.  Wilmon Arms. Corliss Skains, MD, [facility]  03/18/2012 4:36 PM

## 2012-04-03 ENCOUNTER — Ambulatory Visit (INDEPENDENT_AMBULATORY_CARE_PROVIDER_SITE_OTHER): Payer: 59 | Admitting: Gynecology

## 2012-04-03 ENCOUNTER — Encounter: Payer: Self-pay | Admitting: Gynecology

## 2012-04-03 VITALS — BP 122/76 | Ht 63.0 in | Wt 199.0 lb

## 2012-04-03 DIAGNOSIS — N926 Irregular menstruation, unspecified: Secondary | ICD-10-CM

## 2012-04-03 DIAGNOSIS — N979 Female infertility, unspecified: Secondary | ICD-10-CM

## 2012-04-03 DIAGNOSIS — N938 Other specified abnormal uterine and vaginal bleeding: Secondary | ICD-10-CM

## 2012-04-03 DIAGNOSIS — N939 Abnormal uterine and vaginal bleeding, unspecified: Secondary | ICD-10-CM

## 2012-04-03 LAB — PREGNANCY, URINE: Preg Test, Ur: NEGATIVE

## 2012-04-03 MED ORDER — MEGESTROL ACETATE 40 MG PO TABS: 40.0000 mg | ORAL_TABLET | Freq: Two times a day (BID) | ORAL | Status: DC

## 2012-04-03 NOTE — Progress Notes (Signed)
Patient is a 31 year old gravida 0 who presented to the office today as a new patient with a complaint of dysfunctional uterine bleeding. In March of this year she had a laparoscopic cholecystectomy and had a two-day menstrual cycle the 28th March 29. She re\re bled one day on April 2 been started bleeding in April 12 until the present. She denies any cramping nausea or vomiting. She has had history of primary infertility and has had unprotected intercourse. She had a normal Pap smear last year at her primary physician's office.  Exam: Abdomen: Soft nontender no rebound or guarding Pelvic: Bartholin urethra Skene was within normal limits Vagina: Some blood was present in the vaginal vault Cervix: No lesions or discharge Uterus: Upper limits of normal Adnexa: Limited due to patient's abdominal girth but no tenderness elicited Rectal: Not examined  Assessment/plan: Patient with dysfunctional  uterine bleeding overweight with history of primary infertility not using contraception. Patient will have a urine pregnancy test today if it is negative she'll be prescribed Megace 40 mg to take one by mouth twice a day for 5-7 days. She will then return to the office next week for sonohysterogram and possible endometrial biopsy. She'll stop by the lab we'll check a TSH and prolactin as well.

## 2012-04-03 NOTE — Patient Instructions (Signed)
Please call this afternoon at 3 PM and asked to speak with Becky Everett for the results of her urine pregnancy test. If the urine pregnancy test is negative color the pharmacy to pick up her medication (Megace) which she will take 1 twice a day for the next 5-7 days to stop her bleeding. During the time that you are on this medication make sure use barrier contraception. We'll see her next week for the sonohysterogram.  Ecografa transvaginal (Transvaginal Ultrasound) La ecografa transvaginal es una ecografa plvica en la que se utiliza una probeta metlica que se coloca en la vagina, para observar los rganos femeninos. El ecgrafo enva ondas sonoras desde un transductor (sonda). Estas ondas sonoras chocan contra las estructuras del cuerpo (como un eco) y crean Naval architect. La imagen se observa en un monitor. Se denomina transvaginal debido a que la sonda se inserta dentro de la vagina. Puede haber una pequea molestia por la introduccin de la sonda. Esta prueba tambin puede realizarse [employer]. La ecografa endovaginal es otro nombre para la ecografa transvaginal. En una ecografa transabdominal, la sonda se coloca en la parte externa del abdomen. Este mtodo no ofrece imgenes tan buenas como la tcnica transvaginal. La ecogafa transvaginal se utiliza para observar alteraciones en el tracto genital femenino. Entre ellos se incluyen:  Problemas de infertilidad.   Malformaciones congnitas (defecto de nacimiento) del tero y los ovarios.   Tumores en el tero.   Hemorragias anormales.   Tumores y quistes de ovario.   Abscesos (tejidos inflamados y pus) en la pelvis.   Dolor abdominal o plvico sin causa aparente.   Infecciones plvicas.  DURANTE EL EMBARAZO, SE UTILIZA PAR OBSERVAR:  Embarazos normales.   Un embarazo ectpico (embarazo fuera del tero).   Latidos cardacos fetales.   Anormalidades de la pelvis que no se observan bien con la ecografa transabdominal.    Sospecha de gemelos o embarazo mltiple.   Aborto inminente.   Problemas en el cuello del tero (cuello incompetente, no permanece cerrado para contener al beb).   Cuando se realiza una amniocentesis (se retira lquido de la bolsa [place], para ser [place]).   Al buscar anormalidades en el beb.   Para controlar el crecimiento, el desarrollo y la edad del feto.   Para medir la cantidad de lquido en el saco amnitico.   Cuando se realiza una versin externa del beb (se lo mueve a Loss adjuster, chartered).   Evaluar al beb en embarazos de alto riesgo (perfil biofsico).   Si se sospecha el deceso del beb (muerte).  En algunos casos, se utiliza un mtodo especial denominado ecografa con infusin salina, para una observacin ms precisa del tero. Se inyecta solucin salina (agua con sal) dentro del tero en pacientes no embarazadas para observar mejor su interior. Este mtodo no se Glass blower/designer. La probeta tambin puede usarse para obtener biopsias de Insurance claims handler, para drenar lquido de quistes de ovario y para Editor, commissioning un DIU (dispositivo intrauterino para el control de la natalidad) que no pueda [place]. PREPARACIN PARA LA PRUEBA La ecografa transvaginal se realiza con la vejiga vaca. La ecografa transabdominal se realiza con la vejiga llena. Podrn solicitarle que beba varios vasos de agua antes del examen. En algunos casos se realiza una ecografa transabdominal antes de la ecografa transvaginal para obervar los rganos del abdomen. PROCEDIMIENTO  Deber acostarse en una cama, con las rodillas dobladas y los pies en los estribos. La probeta se cubre con un condn. Dentro de  la vagina y en la probeta se aplica un lubricante estril. El lubricante ayuda a transmitir las ondas sonoras y Nature conservation officer la irritacin de la vagina. El mdico mover la sonda en el interior de la cavidad vaginal para escanear las estructuras plvicas. Un examen normal mostrar una pelvis  normal y contenidos normales en su interior. Una prueba anormal mostrar anormalidades en la pelvis, la placenta o el beb. LAS CAUSAS DE UN RESULTADO ANORMAL PUEDEN SER:  Crecimientos o tumores en:   El tero.   Los ovarios.   La vagina.   Otras estructuras plvicas.   Crecimientos no cancerosos en el tero y los ovarios.   El ovario se retuerce y se corta el suministro de [place] (torsin [place]   Las reas de infeccin incluyen:   Enfermedad inflamatoria plvica.   Un absceso en la pelvis.   Ubicacin de un DIU.  LOS PROBLEMAS QUE PUEDEN HALLARSE EN UNA MUJER EMBARAZADA SON:  Embarazo ectpico (embarazo fuera del tero).   Embarazos mltiples.   Dilatacin (apertura) precoz anormal del cuello del tero. Esto puede indicar un cuello incompetente y Surveyor, mining.   Aborto inminente.   Muerte fetal.   Los problemas con la placenta incluyen:   La placenta se ha desarrollado sobre la abertura del cuello del tero (placenta previa).   La placenta se ha separado anticipadamente en el tero (abrupcin placentaria).   La placenta se desarrolla en el msculo del tero (placenta acreta).   Tumores del Psychiatrist, incluyendo la enfermedad trofoblstica gestacional. Se trata de un embarazo anormal en el que no hay feto. El tero se llena de quistes similares a uvas que en algunos casos son cancerosos.   Posicin incorrecta del feto (de nalgas, de vrtice).   Retraso del desarrollo fetal intrauterino (escaso desarrollo en el tero).   Anormalidades o infeccin fetal.  RIESGOS Y COMPLICACIONES No hay riesgos conocidos para la ecografa. No se toman radiografas cuando se realiza una ecografa. Document Released: 03/14/2009 Document Revised: 11/15/2011 [facility] Patient Information 2012 [place], [place].

## 2012-04-09 ENCOUNTER — Other Ambulatory Visit: Payer: Self-pay | Admitting: Anesthesiology

## 2012-04-09 ENCOUNTER — Ambulatory Visit (INDEPENDENT_AMBULATORY_CARE_PROVIDER_SITE_OTHER): Payer: 59 | Admitting: Gynecology

## 2012-04-09 ENCOUNTER — Other Ambulatory Visit: Payer: Self-pay | Admitting: Gynecology

## 2012-04-09 ENCOUNTER — Ambulatory Visit (INDEPENDENT_AMBULATORY_CARE_PROVIDER_SITE_OTHER): Payer: 59

## 2012-04-09 DIAGNOSIS — N8300 Follicular cyst of ovary, unspecified side: Secondary | ICD-10-CM

## 2012-04-09 DIAGNOSIS — N938 Other specified abnormal uterine and vaginal bleeding: Secondary | ICD-10-CM

## 2012-04-09 DIAGNOSIS — N926 Irregular menstruation, unspecified: Secondary | ICD-10-CM

## 2012-04-09 DIAGNOSIS — N979 Female infertility, unspecified: Secondary | ICD-10-CM

## 2012-04-09 DIAGNOSIS — N83209 Unspecified ovarian cyst, unspecified side: Secondary | ICD-10-CM

## 2012-04-09 DIAGNOSIS — N949 Unspecified condition associated with female genital organs and menstrual cycle: Secondary | ICD-10-CM

## 2012-04-09 DIAGNOSIS — N939 Abnormal uterine and vaginal bleeding, unspecified: Secondary | ICD-10-CM

## 2012-04-09 DIAGNOSIS — N840 Polyp of corpus uteri: Secondary | ICD-10-CM

## 2012-04-09 DIAGNOSIS — N854 Malposition of uterus: Secondary | ICD-10-CM

## 2012-04-09 NOTE — Patient Instructions (Signed)
Histeroscopa (Hysteroscopy) La histeroscopa es un procedimiento que se utiliza para observar el interior de tero (matriz). Puede indicarse por diferentes motivos, entre ellos:  Para evaluar una hemorragia anormal, un fibroma (tumor benigno, no cancergeno), plipos, tejido cicatrizal (adherencias), o la posibilidad de cncer de tero.   Para detectar bultos (tumores) y otras anormalidades uterinas.   Para buscar las causas por las que una mujer no queda embarazada (infertilidad), por prdidas recurrentes de embarazo (aborto espontneo) y por la prdida de un dispositivo intrauterino (DIU).   Para llevar a cabo un procedimiento de esterilizacin cerrando las trompas de Falopio que se encuentran dentro del tero.  La histeroscopa se realiza inmediatamente despus del perodo menstrual para asegurarse de que no existe embarazo. INFORME AL PROFESIONAL QUE LO ASISTE SOBRE LOS SIGUIENTES PUNTOS:  Alergias.   Medicamentos que utiliza, incluyendo hierbas, gotas oftlmicas, medicamentos de venta libre y cremas.   Uso de corticoides (por va oral o cremas).   Problemas anteriores con anestsicos.   Antecedentes de hemorragias o problemas sanguneos.   Antecedentes de cogulos sanguneos.   Posibilidad de embarazo, si correspondiera.   Cirugas previas.   Otros problemas de salud.  RIESGOS Y COMPLICACIONES  Perforacin del tero.   Hemorragia excesiva.   Infeccin.   Lesin en el cuello del tero.   Lesiones en los rganos circundantes.   Reaccin alrgica a medicamentos.   Inoculacin de lquido en exceso dentro del tero.  ANTES DEL PROCEDIMIENTO  No tome aspirina ni anticoagulantes durante la semana previa al procedimiento, o segn le hayan indicado. Esta puede ocasionar hemorragias.   Llegue al menos con 60 minutos de anticipacin al procedimiento para leer y firmar los formularios necesarios.   Pdale a alguna persona que la lleve a su casa luego del procedimiento.     Si fuma, no lo haga durante las dos semanas anteriores al procedimiento.  PROCEDIMIENTO  El mdico indicar un medicamento para que se relaje. Podr aplicarle un medicamento para adormecer la zona que rodea al cuello del tero (anestesia local) o administrarle un medicamento que la har dormir (anestesia general).   En algunos casos, el da anterior al procedimiento se coloca un medicamento en el cuello del tero. Este medicamento dilata el cuello y agranda la abertura. Este medicamento facilita la insercin del histeroscopio.   Luego le insertar un pequeo instrumento (histeroscopio) a travs de la vagina, dentro del tero. Este instrumento es similar a un telescopio con luz, del tamao de un lpiz.   Durante el procedimiento le colocarn un lquido o aire en el interior de tero, lo que le permitir al cirujano observar mejor.   En algunas ocasiones, el tejido del interior del tero se legra suavemente. Estas muestras de tejido se envan a un especialista en la observacin de clulas y muestras de tejido (patlogo). El patlogo le dar un informe a su mdico personal. Esto ayudar al mdico a decidir si necesario realizar otro tratamiento. Este informe tambin ayudar al profesional que la asiste a decidir cul es el mejor tratamiento posible, en caso que el resultado no sea normal.  DESPUS DEL PROCEDIMIENTO  Si le han administrado anestesia general se sentir mareada durante algunas horas despus del procedimiento.   Si le han aplicado un anestsico local, le indicarn que haga reposo en el centro quirrgico o en el consultorio del mdico hasta que se encuentre estable y se sienta lista para volver a su casa.   Es posible que sienta clicos durante algunos das.   Podr tener   una hemorragia, que puede variar entre una pequea mancha durante algunos das hasta una hemorragia similar a una menstruacin durante tres a siete das. Esto es normal.   Pdale a alguna persona que la lleve  hasta su domicilio.  AVERIGE LOS RESULTADOS DE SU ANLISIS Durante su visita no contar con todos los resultados de los anlisis. En este caso, tenga otra entrevista con su mdico para conocerlos. No piense que el resultado es normal si no tiene noticias de su mdico o de la institucin mdica. Es importante el seguimiento de todos los resultados de los anlisis. INSTRUCCIONES PARA EL CUIDADO DOMICILIARIO  No conduzca por 24 horas o siga las indicaciones de su mdico.   Solo tome medicamentos de venta libre o recetados para el dolor, el malestar o la fiebre, segn le haya indicado el mdico.   No tome aspirina. Esta puede ocasionar o empeorar la hemorragia.   No conduzca vehculos ni deba al alcohol mientras toma analgsicos.   Puede retomar su dieta habitual.   No use tampones, no se d duchas vaginales ni tenga relaciones sexuales durante dos semanas, o segn lo que le indique su mdico.   Descanse y duerma durante las primeras 24 a 48 horas.   Tmese la temperatura dos veces por da, durante 4  5 das. Antela. Infrmele estas temperaturas al mdico en caso que no sean normales (ms de 98.6 F o 37 C).   Tome todos los medicamentos y en la forma que el profesional le ha indicado, en el modo en que lo ha hecho.   Siga las indicaciones de su mdico con respecto a la dieta, a la actividad fsica, a levantar objetos, a conducir vehculos y a otras actividades en general.   Tome duchas en lugar de baos durante dos semanas, o segn las indicaciones de su mdico.   Si est constipada:   Tome un laxante suave con autorizacin de su mdico.   Consuma alimentos que contengan salvado.   Beba gran cantidad de lquidos para mantener la orina de tono claro o color amarillo plido.   Pdale a alguna persona que permanezca con usted durante las primeras 24 a 48 horas, especialmente si le han administrado anestesia general.   Asegrese que usted y su familia comprenden todo acerca de la  operacin y la recuperacin.   Siga las indicaciones de su mdico con respecto a las visitas de control y el Papanicolaou.  SOLICITE ATENCIN MDICA SI:  Se siente mareada o sufre un desmayo.   Tiene malestar estomacal (nuseas).   Observa una prdida vaginal anormal.   Aparece una erupcin cutnea.   Tiene una reaccin anormal o alrgica al medicamento.   Necesita analgsicos ms fuertes.  SOLICITE ATENCIN MDICA INMEDIATAMENTE SI:  La hemorragia es ms abundante que un perodo menstrual normal u observa cogulos.   La temperatura oral le sube a ms de 102 F (38.9 C) y no puede bajarla con medicamentos.   Siente clicos o el dolor aumenta y no se alivia con la medicacin.   Siente dolor abdominal que no parece estar relacionado con la misma zona en que sinti los clicos y el dolor.   Se desmaya.   Comienza a sentir dolor en la parte superior de los hombros (zona de los breteles).   Le falta el aire.  ASEGRESE QUE:  Comprende estas instrucciones.   Controlar su enfermedad.   Solicitar ayuda de inmediato si no mejora o empeora.  Document Released: 11/26/2005 Document Revised: 11/15/2011 [employer] Patient Information   2012 [employer].   

## 2012-04-09 NOTE — Progress Notes (Signed)
Patient is a 31 year old gravida 0 who was seen in the office on April 25 as a new patient with complaint of dysfunctional uterine bleeding. In March of this year she had a laparoscopic cholecystectomy.In March of this year she had a laparoscopic cholecystectomy and had a two-day menstrual cycle the 28th March 29. She re\re bled one day on April 2 been started bleeding in April 12 until the present. She denies any cramping nausea or vomiting. She has had history of primary infertility and has had unprotected intercourse. She had a normal Pap smear last year at her primary physician's office. Patient had her TSH and prolactin drawn today and presented to the office for sonohysterogram to complete evaluation of her dysfunctional uterine bleeding.  Ultrasound today demonstrated uterus that measured 8.3 x 6.9 x 4.6 cm with an endometrial stripe of 16.1 mm. Right ovary thinwall echo-free cyst with a thin septum measuring 32 x 20 x 36 mm left ovary and a small follicle 1919 mm no fluid in the cul-de-sac sonohysterogram demonstrated a left posterior uterine polyp measured 10 x 10 x 8 mm located near the lower uterine segment.  Patient had been placed on Megace 40 mg twice a day for 7 days and was asked to refrain from intercourse during this time.  Assessment/plan: Dysfunctional uterine bleeding attributed to endometrial polyp. Literature information was provided on resectoscopic polypectomy. The risks benefits and pros and cons of the operation were discussed as follows:                      Patient was counseled as to the risk of surgery to include the following:  1. Infection (prohylactic antibiotics will be administered)  2. DVT/Pulmonary Embolism (prophylactic pneumo compression stockings will be used)  3.Trauma to internal organs requiring additional surgical procedure to repair any injury to     Internal organs requiring perhaps additional hospitalization days.  4.Hemmorhage requiring transfusion and  blood products which carry risks such as             anaphylactic reaction, hepatitis and AIDS  Patient had received literature information on the procedure scheduled and all her questions were answered in her native tongue and accepts all risk.  We'll schedule surgery the next few weeks.  [facility] HMD4:08 PMTD@

## 2012-04-10 ENCOUNTER — Telehealth: Payer: Self-pay | Admitting: Gynecology

## 2012-04-10 ENCOUNTER — Other Ambulatory Visit: Payer: Self-pay | Admitting: Gynecology

## 2012-04-10 DIAGNOSIS — N938 Other specified abnormal uterine and vaginal bleeding: Secondary | ICD-10-CM

## 2012-04-10 MED ORDER — MEGESTROL ACETATE 40 MG PO TABS: 40.0000 mg | ORAL_TABLET | Freq: Two times a day (BID) | ORAL | Status: AC

## 2012-04-10 NOTE — Telephone Encounter (Signed)
I spoke with patient and scheduled Resectoscopic Polypectomy at [facility] for Friday, May 10 at 7:30am.  Dr. Glenetta Hew said no preop consult needed.  He did want to keep her on the Megace until surgery per his note to me and I went ahead and e-scribed that to her pharmacy and instructed patient.  I also told her that he wanted me to stress to her the importance of using condoms each time she has until surgery. She said she understood that.

## 2012-04-11 ENCOUNTER — Encounter (HOSPITAL_COMMUNITY): Payer: Self-pay

## 2012-04-14 ENCOUNTER — Encounter (HOSPITAL_COMMUNITY): Payer: Self-pay | Admitting: *Deleted

## 2012-04-17 ENCOUNTER — Telehealth: Payer: Self-pay | Admitting: *Deleted

## 2012-04-17 MED ORDER — CEFAZOLIN SODIUM-DEXTROSE 2-3 GM-% IV SOLR
2.0000 g | INTRAVENOUS | Status: AC
  Administered 2012-04-18: 2 g via INTRAVENOUS
  Filled 2012-04-17: qty 50

## 2012-04-17 NOTE — Telephone Encounter (Signed)
Pt informed of 5/1 normal lab results.

## 2012-04-18 ENCOUNTER — Ambulatory Visit (HOSPITAL_COMMUNITY): Payer: 59 | Admitting: Anesthesiology

## 2012-04-18 ENCOUNTER — Encounter (HOSPITAL_COMMUNITY): Payer: Self-pay | Admitting: Anesthesiology

## 2012-04-18 ENCOUNTER — Ambulatory Visit (HOSPITAL_COMMUNITY)
Admission: RE | Admit: 2012-04-18 | Discharge: 2012-04-18 | Disposition: A | Discharge status: Home / Self Care | Payer: 59 | Source: Ambulatory Visit | Attending: Gynecology | Admitting: Gynecology

## 2012-04-18 ENCOUNTER — Encounter (HOSPITAL_COMMUNITY)
Admission: RE | Disposition: A | Discharge status: Home / Self Care | Payer: Self-pay | Source: Ambulatory Visit | Attending: Gynecology

## 2012-04-18 DIAGNOSIS — N938 Other specified abnormal uterine and vaginal bleeding: Secondary | ICD-10-CM

## 2012-04-18 DIAGNOSIS — N840 Polyp of corpus uteri: Secondary | ICD-10-CM | POA: Insufficient documentation

## 2012-04-18 DIAGNOSIS — Z01818 Encounter for other preprocedural examination: Secondary | ICD-10-CM | POA: Insufficient documentation

## 2012-04-18 DIAGNOSIS — Z9889 Other specified postprocedural states: Secondary | ICD-10-CM

## 2012-04-18 DIAGNOSIS — N925 Other specified irregular menstruation: Secondary | ICD-10-CM

## 2012-04-18 DIAGNOSIS — N949 Unspecified condition associated with female genital organs and menstrual cycle: Secondary | ICD-10-CM

## 2012-04-18 DIAGNOSIS — Z01812 Encounter for preprocedural laboratory examination: Secondary | ICD-10-CM | POA: Insufficient documentation

## 2012-04-18 LAB — URINE MICROSCOPIC-ADD ON

## 2012-04-18 LAB — URINALYSIS, ROUTINE W REFLEX MICROSCOPIC
Bilirubin Urine: NEGATIVE
Ketones, ur: NEGATIVE mg/dL
Specific Gravity, Urine: 1.025 (ref 1.005–1.030)
Urobilinogen, UA: 0.2 mg/dL (ref 0.0–1.0)
pH: 6 (ref 5.0–8.0)

## 2012-04-18 LAB — CBC
HCT: 38.1 % (ref 36.0–46.0)
MCH: 29.5 pg (ref 26.0–34.0)
MCV: 88.4 fL (ref 78.0–100.0)
RDW: 13 % (ref 11.5–15.5)
WBC: 9.4 10*3/uL (ref 4.0–10.5)

## 2012-04-18 SURGERY — DILATATION & CURETTAGE/HYSTEROSCOPY WITH RESECTOCOPE
Anesthesia: General | Site: Uterus | Wound class: Clean Contaminated

## 2012-04-18 MED ORDER — FENTANYL CITRATE 0.05 MG/ML IJ SOLN
INTRAMUSCULAR | Status: AC
  Filled 2012-04-18: qty 2

## 2012-04-18 MED ORDER — DEXAMETHASONE SODIUM PHOSPHATE 4 MG/ML IJ SOLN
INTRAMUSCULAR | Status: DC | PRN
  Administered 2012-04-18: 10 mg via INTRAVENOUS

## 2012-04-18 MED ORDER — SILVER NITRATE-POT NITRATE 75-25 % EX MISC
CUTANEOUS | Status: AC
  Filled 2012-04-18: qty 1

## 2012-04-18 MED ORDER — ONDANSETRON HCL 4 MG/2ML IJ SOLN: 4.0000 mg | Freq: Once | INTRAMUSCULAR | Status: DC | PRN

## 2012-04-18 MED ORDER — SODIUM CHLORIDE 0.9 % IR SOLN
Status: DC | PRN
  Administered 2012-04-18: 1

## 2012-04-18 MED ORDER — KETOROLAC TROMETHAMINE 30 MG/ML IJ SOLN
INTRAMUSCULAR | Status: DC | PRN
  Administered 2012-04-18: 30 mg via INTRAVENOUS

## 2012-04-18 MED ORDER — KETOROLAC TROMETHAMINE 30 MG/ML IJ SOLN: 15.0000 mg | Freq: Once | INTRAMUSCULAR | Status: DC | PRN

## 2012-04-18 MED ORDER — LIDOCAINE HCL (CARDIAC) 20 MG/ML IV SOLN
INTRAVENOUS | Status: DC | PRN
  Administered 2012-04-18: 100 mg via INTRAVENOUS

## 2012-04-18 MED ORDER — MIDAZOLAM HCL 5 MG/5ML IJ SOLN
INTRAMUSCULAR | Status: DC | PRN
  Administered 2012-04-18: 2 mg via INTRAVENOUS

## 2012-04-18 MED ORDER — MIDAZOLAM HCL 2 MG/2ML IJ SOLN
INTRAMUSCULAR | Status: AC
  Filled 2012-04-18: qty 2

## 2012-04-18 MED ORDER — PROPOFOL 10 MG/ML IV EMUL
INTRAVENOUS | Status: AC
  Filled 2012-04-18: qty 20

## 2012-04-18 MED ORDER — OXYCODONE-ACETAMINOPHEN 5-500 MG PO CAPS: 1.0000 | ORAL_CAPSULE | ORAL | Status: AC | PRN

## 2012-04-18 MED ORDER — SILVER NITRATE-POT NITRATE 75-25 % EX MISC
CUTANEOUS | Status: DC | PRN
  Administered 2012-04-18: 1

## 2012-04-18 MED ORDER — PROPOFOL 10 MG/ML IV EMUL
INTRAVENOUS | Status: DC | PRN
  Administered 2012-04-18: 200 mg via INTRAVENOUS
  Administered 2012-04-18: 100 mg via INTRAVENOUS

## 2012-04-18 MED ORDER — ONDANSETRON HCL 4 MG/2ML IJ SOLN
INTRAMUSCULAR | Status: AC
  Filled 2012-04-18: qty 2

## 2012-04-18 MED ORDER — FENTANYL CITRATE 0.05 MG/ML IJ SOLN
INTRAMUSCULAR | Status: DC | PRN
  Administered 2012-04-18: 100 ug via INTRAVENOUS
  Administered 2012-04-18: 25 ug via INTRAVENOUS

## 2012-04-18 MED ORDER — LACTATED RINGERS IV SOLN
INTRAVENOUS | Status: DC
  Administered 2012-04-18 (×2): 125 mL/h via INTRAVENOUS

## 2012-04-18 MED ORDER — MEPERIDINE HCL 25 MG/ML IJ SOLN: 6.2500 mg | INTRAMUSCULAR | Status: DC | PRN

## 2012-04-18 MED ORDER — KETOROLAC TROMETHAMINE 30 MG/ML IJ SOLN
INTRAMUSCULAR | Status: AC
  Filled 2012-04-18: qty 1

## 2012-04-18 MED ORDER — ONDANSETRON HCL 4 MG/2ML IJ SOLN
INTRAMUSCULAR | Status: DC | PRN
  Administered 2012-04-18: 4 mg via INTRAVENOUS

## 2012-04-18 MED ORDER — FENTANYL CITRATE 0.05 MG/ML IJ SOLN: 25.0000 ug | INTRAMUSCULAR | Status: DC | PRN

## 2012-04-18 MED ORDER — DEXAMETHASONE SODIUM PHOSPHATE 10 MG/ML IJ SOLN
INTRAMUSCULAR | Status: AC
  Filled 2012-04-18: qty 1

## 2012-04-18 SURGICAL SUPPLY — 20 items
BLADE INCISOR TRUC PLUS 2.9 (ABLATOR) IMPLANT
CATH ROBINSON RED A/P 16FR (CATHETERS) ×2 IMPLANT
CLOTH BEACON ORANGE TIMEOUT ST (SAFETY) ×2 IMPLANT
CONTAINER PREFILL 10% NBF 60ML (FORM) ×3 IMPLANT
CORD ACTIVE DISPOSABLE (ELECTRODE) ×1
CORD ELECTRO ACTIVE DISP (ELECTRODE) ×1 IMPLANT
DRAPE HYSTEROSCOPY (DRAPE) ×2 IMPLANT
ELECT REM PT RETURN 9FT ADLT (ELECTROSURGICAL) ×2
ELECTRODE REM PT RTRN 9FT ADLT (ELECTROSURGICAL) ×1 IMPLANT
GLOVE BIOGEL PI IND STRL 8 (GLOVE) ×1 IMPLANT
GLOVE BIOGEL PI INDICATOR 8 (GLOVE) ×1
GLOVE ECLIPSE 7.5 STRL STRAW (GLOVE) ×4 IMPLANT
GOWN PREVENTION PLUS LG XLONG (DISPOSABLE) ×2 IMPLANT
GOWN STRL REIN XL XLG (GOWN DISPOSABLE) ×2 IMPLANT
INCISOR TRUC PLUS BLADE 2.9 (ABLATOR) ×2
KIT HYSTEROSCOPY TRUCLEAR (ABLATOR) ×1 IMPLANT
PACK ABDOMINAL MINOR (CUSTOM PROCEDURE TRAY) ×1 IMPLANT
PAD PREP 24X48 CUFFED NSTRL (MISCELLANEOUS) ×2 IMPLANT
TOWEL OR 17X24 6PK STRL BLUE (TOWEL DISPOSABLE) ×4 IMPLANT
WATER STERILE IRR 1000ML POUR (IV SOLUTION) ×2 IMPLANT

## 2012-04-18 NOTE — Anesthesia Procedure Notes (Signed)
Procedure Name: LMA Insertion Date/Time: 04/18/2012 7:38 AM Performed by: Alisea Matte, Jannet Askew Pre-anesthesia Checklist: Patient identified, Timeout performed, Emergency Drugs available, Suction available and Patient being monitored Patient Re-evaluated:Patient Re-evaluated prior to inductionOxygen Delivery Method: Circle system utilized Preoxygenation: Pre-oxygenation with 100% oxygen Intubation Type: IV induction LMA: LMA inserted LMA Size: 4.0 Number of attempts: 1 Placement Confirmation: positive ETCO2 Dental Injury: Teeth and Oropharynx as per pre-operative assessment

## 2012-04-18 NOTE — Transfer of Care (Signed)
Immediate Anesthesia Transfer of Care Note  Patient: Becky Everett  Procedure(s) Performed: Procedure(s) (LRB): DILATATION & CURETTAGE/HYSTEROSCOPY WITH RESECTOCOPE (N/A)  Patient Location: PACU  Anesthesia Type: General  Level of Consciousness: awake, alert  and oriented  Airway & Oxygen Therapy: Patient Spontanous Breathing and Patient connected to nasal cannula oxygen  Post-op Assessment: Report given to PACU RN and Post -op Vital signs reviewed and stable  Post vital signs: Reviewed and stable  Complications: No apparent anesthesia complications

## 2012-04-18 NOTE — Discharge Instructions (Signed)
Post op instructions: Post op appointment 24th May at 11:00 am  DISCHARGE INSTRUCTIONS: HYSTEROSCOPY / ENDOMETRIAL ABLATION The following instructions have been prepared to help you care for yourself upon your return home.  May take Ibuprofen/Motrin products after 2:15 pm as needed for cramping.  Personal hygiene: Marland Kitchen Use sanitary pads for vaginal drainage, not tampons. . Shower the day after your procedure. . NO tub baths, pools or Jacuzzis for 2-3 weeks. . Wipe front to back after using the bathroom.  Activity and limitations: . Do NOT drive or operate any equipment for 24 hours. The effects of anesthesia are still present and drowsiness may result. . Do NOT rest in bed all day. . Walking is encouraged. . Walk up and down stairs slowly. . You may resume your normal activity in one to two days or as indicated by your physician. Sexual activity: NO intercourse for at least 2 weeks after the procedure, or as indicated by your Doctor.  Diet: Eat a light meal as desired this evening. You may resume your usual diet tomorrow.  Return to Work: You may resume your work activities in one to two days or as indicated by Therapist, sports.  What to expect after your surgery: Expect to have vaginal bleeding/discharge for 2-3 days and spotting for up to 10 days. It is not unusual to have soreness for up to 1-2 weeks. You may have a slight burning sensation when you urinate for the first day. Mild cramps may continue for a couple of days. You may have a regular period in 2-6 weeks.  Call your doctor for any of the following: . Excessive vaginal bleeding or clotting, saturating and changing one pad every hour. . Inability to urinate 6 hours after discharge from hospital. . Pain not relieved by pain medication. . Fever of 100.4 F or greater. . Unusual vaginal discharge or odor.  \Post Anesthesia Care Unit [phone]

## 2012-04-18 NOTE — Anesthesia Postprocedure Evaluation (Signed)
  Anesthesia Post-op Note  Patient: Becky Everett  Procedure(s) Performed: Procedure(s) (LRB): DILATATION & CURETTAGE/HYSTEROSCOPY WITH RESECTOCOPE (N/A)  Patient is awake and responsive. Pain and nausea are reasonably well controlled. Vital signs are stable and clinically acceptable. Oxygen saturation is clinically acceptable. There are no apparent anesthetic complications at this time. Patient is ready for discharge.

## 2012-04-18 NOTE — Interval H&P Note (Signed)
History and Physical Interval Note:  04/18/2012 7:22 AM  Becky Everett  has presented today for surgery, with the diagnosis of Endometrial Polyp  The various methods of treatment have been discussed with the patient and family. After consideration of risks, benefits and other options for treatment, the patient has consented to  Procedure(s) (LRB): DILATATION & CURETTAGE/HYSTEROSCOPY WITH RESECTOCOPE (N/A) as a surgical intervention .  The patients' history has been reviewed, patient examined, no change in status, stable for surgery.  I have reviewed the patients' chart and labs.  Questions were answered to the patient's satisfaction.     Ok Edwards

## 2012-04-18 NOTE — Anesthesia Preprocedure Evaluation (Addendum)
Anesthesia Evaluation  Patient identified by MRN, date of birth, ID band Patient awake    Reviewed: Allergy & Precautions, H&P , NPO status , Patient's Chart, lab work & pertinent test results  Airway Mallampati: I TM Distance: >3 FB Neck ROM: full    Dental No notable dental hx. (+) Teeth Intact   Pulmonary neg pulmonary ROS,    Pulmonary exam normal       Cardiovascular negative cardio ROS      Neuro/Psych negative psych ROS   GI/Hepatic negative GI ROS, Neg liver ROS,   Endo/Other  Morbid obesity  Renal/GU negative Renal ROS  negative genitourinary   Musculoskeletal negative musculoskeletal ROS (+)   Abdominal (+) + obese,   Peds negative pediatric ROS (+)  Hematology negative hematology ROS (+)   Anesthesia Other Findings   Reproductive/Obstetrics negative OB ROS                           Anesthesia Physical Anesthesia Plan  ASA: III  Anesthesia Plan: General   Post-op Pain Management:    Induction: Intravenous  Airway Management Planned: LMA  Additional Equipment:   Intra-op Plan:   Post-operative Plan:   Informed Consent: I have reviewed the patients History and Physical, chart, labs and discussed the procedure including the risks, benefits and alternatives for the proposed anesthesia with the patient or authorized representative who has indicated his/her understanding and acceptance.     Plan Discussed with: CRNA and Surgeon  Anesthesia Plan Comments:         Anesthesia Quick Evaluation  

## 2012-04-18 NOTE — H&P (View-Only) (Signed)
Patient is a 30-year-old gravida 0 who was seen in the office on April 25 as a new patient with complaint of dysfunctional uterine bleeding. In March of this year she had a laparoscopic cholecystectomy.In March of this year she had a laparoscopic cholecystectomy and had a two-day menstrual cycle the 28th March 29. She re\re bled one day on April 2 been started bleeding in April 12 until the present. She denies any cramping nausea or vomiting. She has had history of primary infertility and has had unprotected intercourse. She had a normal Pap smear last year at her primary physician's office. Patient had her TSH and prolactin drawn today and presented to the office for sonohysterogram to complete evaluation of her dysfunctional uterine bleeding.  Ultrasound today demonstrated uterus that measured 8.3 x 6.9 x 4.6 cm with an endometrial stripe of 16.1 mm. Right ovary thinwall echo-free cyst with a thin septum measuring 32 x 20 x 36 mm left ovary and a small follicle 1919 mm no fluid in the cul-de-sac sonohysterogram demonstrated a left posterior uterine polyp measured 10 x 10 x 8 mm located near the lower uterine segment.  Patient had been placed on Megace 40 mg twice a day for 7 days and was asked to refrain from intercourse during this time.  Assessment/plan: Dysfunctional uterine bleeding attributed to endometrial polyp. Literature information was provided on resectoscopic polypectomy. The risks benefits and pros and cons of the operation were discussed as follows:                      Patient was counseled as to the risk of surgery to include the following:  1. Infection (prohylactic antibiotics will be administered)  2. DVT/Pulmonary Embolism (prophylactic pneumo compression stockings will be used)  3.Trauma to internal organs requiring additional surgical procedure to repair any injury to     Internal organs requiring perhaps additional hospitalization days.  4.Hemmorhage requiring transfusion and  blood products which carry risks such as             anaphylactic reaction, hepatitis and AIDS  Patient had received literature information on the procedure scheduled and all her questions were answered in her native tongue and accepts all risk.  We'll schedule surgery the next few weeks.  Dejia Ebron HMD4:08 PMTD@   

## 2012-04-18 NOTE — Op Note (Signed)
04/18/2012  8:14 AM  PATIENT:  Becky Everett  31 y.o. female  PRE-OPERATIVE DIAGNOSIS:  Endometrial Polyp, dysfunctional uterine bleeding  POST-OPERATIVE DIAGNOSIS:  Endometrial Polyp, dysfunctional uterine bleeding  PROCEDURE:  Procedure(s): Resectoscopic polypectomy with Smith/Nephew true clear resectoscopic morcellator  SURGEON:  Surgeon(s): Ok Edwards, MD  ANESTHESIA:   general  FINDINGS: Scattered endometrial polyps mostly in the lower uterine segment. The tubal ostia identified. Normal smooth endocervical canal.  DESCRIPTION OF OPERATION: The patient was taken to the operating room where she underwent successful general endotracheal anesthesia. Patient received a gram of Ancef prophylactically as well as had PAS stockings. She was placed in the high lithotomy position and the vagina and perineum were prepped and draped in usual sterile fashion. Bimanual examination demonstrated slightly anteverted uterus with no palpable adnexal masses. The bladder was evacuated of its contents with a red Roxan Hockey for approximately 30 cc. A weighted speculum was placed on the posterior vaginal vault. A single-tooth tenaculum was placed on the anterior cervical lip. The uterus sounded to 7-1/2 cm. A Pratt dilator was inserted into the uterine cavity to dilate the cervix to size 17. The Smith/nephew true clear resectoscopic morcellator was placed through the 5 mm scope. The incisor blade was 2.9 mm on the hysteroscopic morcellator. The distending media was normal saline. The morcellator was introduced into the intrauterine cavity and in a systematic fashion the uterine cavity was inspected with the findings as described above. The morcellator was activated and the endometrial polyps  were systematically removed leaving a clear endometrial cavity. Pre-and post procedure pictures will be in the patient's record. Patient tolerated the procedure well. Specimen submitted for histological evaluation.  Fluid deficit was 140 cc. Patient was to receive Toradol 30 mg IV in route to the recovery room.  ESTIMATED BLOOD LOSS: Minimal No intake or output data in the 24 hours ending 04/18/12 0814   BLOOD ADMINISTERED:none   LOCAL MEDICATIONS USED:  NONE  SPECIMEN:  Source of Specimen:  Endometrial polyps  DISPOSITION OF SPECIMEN:  PATHOLOGY  COUNTS:  YES  PLAN OF CARE: Transfer to PACU  [facility] HMD8:14 AMTD@

## 2012-05-02 ENCOUNTER — Ambulatory Visit: Payer: 59 | Admitting: Gynecology

## 2012-05-13 ENCOUNTER — Ambulatory Visit: Payer: 59 | Admitting: Gynecology

## 2012-05-13 ENCOUNTER — Ambulatory Visit (INDEPENDENT_AMBULATORY_CARE_PROVIDER_SITE_OTHER): Payer: 59 | Admitting: Gynecology

## 2012-05-13 ENCOUNTER — Encounter: Payer: Self-pay | Admitting: Gynecology

## 2012-05-13 VITALS — BP 118/70

## 2012-05-13 DIAGNOSIS — Z9889 Other specified postprocedural states: Secondary | ICD-10-CM

## 2012-05-13 NOTE — Patient Instructions (Signed)
Please remember to take 1 prenatal vitamin daily. Also you can purchase at her local pharmacy and ovulation predictor kit so that uni time or intercourse and help you conceive. The first day of your rcycle is day 1. On day 12 through 16 use the ovulation predictor kit.When you notice a change you should have intercourse that night and the next 2 nights. Good luck!

## 2012-05-13 NOTE — Progress Notes (Signed)
Patient is a 31 year old who presented to the office today for her first postop visit. Patient status post resectoscopic polypectomy secondary to dysfunctional uterine bleeding. Preoperatively patient had had a sonohysterogram which demonstrated endometrial polyp. She had had a normal TSH and prolactin drawn as well. Patient with no complaints and had a normal menstrual cycle recently.  Exam: Abdomen: Soft nontender no rebound guarding Pelvic: Bartholin urethra Skene was within normal limits Vagina: No lesions or discharge Cervix: No lesions or discharge Uterus: Anteverted normal size shape and consistency Adnexa: No palpable masses or tenderness Rectal: Not examined  Pathology report: Endometrial polyp - SECRETORY PATTERN ENDOMETRIUM WITH FOCAL BREAKDOWN. - NO HYPERPLASIA, ATYPIA OR MALIGNANCY IDENTIFIED. - SEE COMMENT. Microscopic Comment The secretory endometrium present has a polypoid appearance although a definitive polyp is not identified. Focal breakdown is present. This pattern can be associated with menses, irregular shedding or breakthrough bleeding associated with hormonal therapy.  Pathology report as well as pictures from the findings at time of surgery which year with the patient and all questions were answered. Patient wishes to get pregnant and was recommended that she start on prenatal vitamins. We also discussed the utilization of ovulation predictor kit and timing of intercourse. She'll return with her primary physician Dr. Wallace Cullens and to see [place] in the future for any GYN related issues.

## 2013-02-20 DIAGNOSIS — Q829 Congenital malformation of skin, unspecified: Secondary | ICD-10-CM | POA: Insufficient documentation

## 2013-02-20 DIAGNOSIS — E559 Vitamin D deficiency, unspecified: Secondary | ICD-10-CM | POA: Insufficient documentation

## 2013-02-20 DIAGNOSIS — M26629 Arthralgia of temporomandibular joint, unspecified side: Secondary | ICD-10-CM | POA: Insufficient documentation

## 2013-02-20 DIAGNOSIS — E781 Pure hyperglyceridemia: Secondary | ICD-10-CM | POA: Insufficient documentation

## 2013-04-13 ENCOUNTER — Encounter: Payer: Self-pay | Admitting: Gynecology

## 2013-04-13 ENCOUNTER — Ambulatory Visit (INDEPENDENT_AMBULATORY_CARE_PROVIDER_SITE_OTHER): Payer: 59 | Admitting: Gynecology

## 2013-04-13 ENCOUNTER — Other Ambulatory Visit (HOSPITAL_COMMUNITY)
Admission: RE | Admit: 2013-04-13 | Discharge: 2013-04-13 | Disposition: A | Discharge status: Home / Self Care | Payer: 59 | Source: Ambulatory Visit | Attending: Gynecology | Admitting: Gynecology

## 2013-04-13 VITALS — BP 126/78 | Ht 62.25 in | Wt 202.0 lb

## 2013-04-13 DIAGNOSIS — Z01419 Encounter for gynecological examination (general) (routine) without abnormal findings: Secondary | ICD-10-CM | POA: Insufficient documentation

## 2013-04-13 DIAGNOSIS — Z1151 Encounter for screening for human papillomavirus (HPV): Secondary | ICD-10-CM | POA: Insufficient documentation

## 2013-04-13 MED ORDER — DOXYCYCLINE HYCLATE 100 MG PO CAPS: 100.0000 mg | ORAL_CAPSULE | Freq: Two times a day (BID) | ORAL | Status: DC

## 2013-04-13 NOTE — Patient Instructions (Addendum)
Tetanus, Diphtheria, Pertussis (Tdap) Vaccine What You Need to Know WHY GET VACCINATED? Tetanus, diphtheria and pertussis can be very serious diseases, even for adolescents and adults. Tdap vaccine can protect us from these diseases. TETANUS (Lockjaw) causes painful muscle tightening and stiffness, usually all over the body.  It can lead to tightening of muscles in the head and neck so you can't open your mouth, swallow, or sometimes even breathe. Tetanus kills about 1 out of 5 people who are infected. DIPHTHERIA can cause a thick coating to form in the back of the throat.  It can lead to breathing problems, paralysis, heart failure, and death. PERTUSSIS (Whooping Cough) causes severe coughing spells, which can cause difficulty breathing, vomiting and disturbed sleep.  It can also lead to weight loss, incontinence, and rib fractures. Up to 2 in 100 adolescents and 5 in 100 adults with pertussis are hospitalized or have complications, which could include pneumonia and death. These diseases are caused by bacteria. Diphtheria and pertussis are spread from person to person through coughing or sneezing. Tetanus enters the body through cuts, scratches, or wounds. Before vaccines, the [place] saw as many as 200,000 cases a year of diphtheria and pertussis, and hundreds of cases of tetanus. Since vaccination began, tetanus and diphtheria have dropped by about 99% and pertussis by about 80%. TDAP VACCINE Tdap vaccine can protect adolescents and adults from tetanus, diphtheria, and pertussis. One dose of Tdap is routinely given at age 11 or 12. People who did not get Tdap at that age should get it as soon as possible. Tdap is especially important for health care professionals and anyone having close contact with a baby younger than 12 months. Pregnant women should get a dose of Tdap during every pregnancy, to protect the newborn from pertussis. Infants are most at risk for severe, life-threatening  complications from pertussis. A similar vaccine, called Td, protects from tetanus and diphtheria, but not pertussis. A Td booster should be given every 10 years. Tdap may be given as one of these boosters if you have not already gotten a dose. Tdap may also be given after a severe cut or burn to prevent tetanus infection. Your doctor can give you more information. Tdap may safely be given at the same time as other vaccines. SOME PEOPLE SHOULD NOT GET THIS VACCINE  If you ever had a life-threatening allergic reaction after a dose of any tetanus, diphtheria, or pertussis containing vaccine, OR if you have a severe allergy to any part of this vaccine, you should not get Tdap. Tell your doctor if you have any severe allergies.  If you had a coma, or long or multiple seizures within 7 days after a childhood dose of DTP or DTaP, you should not get Tdap, unless a cause other than the vaccine was found. You can still get Td.  Talk to your doctor if you:  have epilepsy or another nervous system problem,  had severe pain or swelling after any vaccine containing diphtheria, tetanus or pertussis,  ever had Guillain-Barr Syndrome (GBS),  aren't feeling well on the day the shot is scheduled. RISKS OF A VACCINE REACTION With any medicine, including vaccines, there is a chance of side effects. These are usually mild and go away on their own, but serious reactions are also possible. Brief fainting spells can follow a vaccination, leading to injuries from falling. Sitting or lying down for about 15 minutes can help prevent these. Tell your doctor if you feel dizzy or light-headed, or   have vision changes or ringing in the ears. Mild problems following Tdap (Did not interfere with activities)  Pain where the shot was given (about 3 in 4 adolescents or 2 in 3 adults)  Redness or swelling where the shot was given (about 1 person in 5)  Mild fever of at least 100.64F (up to about 1 in 25 adolescents or 1 in  100 adults)  Headache (about 3 or 4 people in 10)  Tiredness (about 1 person in 3 or 4)  Nausea, vomiting, diarrhea, stomach ache (up to 1 in 4 adolescents or 1 in 10 adults)  Chills, body aches, sore joints, rash, swollen glands (uncommon) Moderate problems following Tdap (Interfered with activities, but did not require medical attention)  Pain where the shot was given (about 1 in 5 adolescents or 1 in 100 adults)  Redness or swelling where the shot was given (up to about 1 in 16 adolescents or 1 in 25 adults)  Fever over 102F (about 1 in 100 adolescents or 1 in 250 adults)  Headache (about 3 in 20 adolescents or 1 in 10 adults)  Nausea, vomiting, diarrhea, stomach ache (up to 1 or 3 people in 100)  Swelling of the entire arm where the shot was given (up to about 3 in 100). Severe problems following Tdap (Unable to perform usual activities, required medical attention)  Swelling, severe pain, bleeding and redness in the arm where the shot was given (rare). A severe allergic reaction could occur after any vaccine (estimated less than 1 in a million doses). WHAT IF THERE IS A SERIOUS REACTION? What should I look for?  Look for anything that concerns you, such as signs of a severe allergic reaction, very high fever, or behavior changes. Signs of a severe allergic reaction can include hives, swelling of the face and throat, difficulty breathing, a fast heartbeat, dizziness, and weakness. These would start a few minutes to a few hours after the vaccination. What should I do?  If you think it is a severe allergic reaction or other emergency that can't wait, call 9-1-1 or get the person to the nearest hospital. Otherwise, call your doctor.  Afterward, the reaction should be reported to the "Vaccine Adverse Event Reporting System" (VAERS). Your doctor might file this report, or you can do it yourself through the VAERS web site at www.vaers.LAgents.no, or by calling 1-(737) 359-9889. VAERS is  only for reporting reactions. They do not give medical advice.  THE NATIONAL VACCINE INJURY COMPENSATION PROGRAM The National Vaccine Injury Compensation Program (VICP) is a federal program that was created to compensate people who may have been injured by certain vaccines. Persons who believe they may have been injured by a vaccine can learn about the program and about filing a claim by calling 1-240-288-3727 or visiting the VICP website at SpiritualWord.at. HOW CAN I LEARN MORE?  Ask your doctor.  Call your local or state health department.  Contact the Centers for Disease Control and Prevention (CDC):  Call (417)063-9428 or visit CDC's website at PicCapture.uy CDC Tdap Vaccine VIS (04/17/12) Document Released: 05/27/2012 Document Reviewed: 05/27/2012 [facility] Patient Information 2013 [place], [place].  Hysterosalpingography Hysterosalpingography is a procedure using an X-ray and contrast dye to look at the inside of your uterus and fallopian tubes. The contrast dye is injected into the uterus through the vagina and cervix while X-ray pictures are taken. This procedure may help your caregiver determine whether you have uterine tumors, adhesions, or structural abnormalities. It is commonly used to help determine  reasons why a woman is unable to have children (infertile). LET YOUR CAREGIVER KNOW ABOUT:  Allergies to medicine or food, especially shellfish.  Medicines taken, including vitamins, herbs, eyedrops, over-the-counter medicines, and creams.  Use of steroids (by mouth or creams).  Previous problems with anesthetics or numbing medicines.  History of bleeding problems or blood clots.  Previous pelvic surgery.  Other health problems, including diabetes and kidney problems.  Possibility of pregnancy.  Any allergies to iodine or X-ray dye (contrast dye).  Any recent pelvic infections or sexually transmitted diseases (STDs). RISKS AND COMPLICATIONS    Infection in the lining of the uterus (endometritis) or fallopian tubes (salpingitis).  Damage or puncturing of the uterus or fallopian tubes.  An allergic reaction to the contrast dye used to perform the X-ray. BEFORE THE PROCEDURE   Schedule the procedure after your period stops, but before your next ovulation. This is usually between day 5 and 10 of your last period. Day 1 is the first day of your period.  Ask your caregiver about changing or stopping your regular medicines.  You may eat and drink as normal.  You may be given a medicine to relax you (sedative) or an over-the-counter pain medicine to lessen any discomfort during the procedure.  Empty your bladder before the procedure begins. PROCEDURE  You will lie down on an X-ray table with your feet in stirrups.  A device called a speculum will be placed into your vagina. This allows your caregiver to see inside your vagina to the cervix.  The cervix will be washed with a special soap.  A thin, flexible tube will be passed through the cervix into the uterus.  Contrast dye will be put into this tube.  Several X-rays will be taken as the contrast dye spreads through the uterus and fallopian tubes.  The tube will be taken out after the procedure.  The procedure usually lasts about 15 to 30 minutes. AFTER THE PROCEDURE   Most of the contrast dye will flow out naturally. You may want to wear a sanitary pad.  You may have cramping and spotting. This should go away in 24 hours.  Ask when your test results will be ready. Make sure you get your test results. Document Released: 12/29/2004 Document Revised: 02/18/2012 Document Reviewed: 10/16/2011 [facility] Patient Information 2013 [place], [place].   When period starts call office [phone] so that they will schedule your HSG at [facility]

## 2013-04-13 NOTE — Progress Notes (Signed)
Becky Everett 05-04-1981 [id number]   History:    32 y.o.  for annual gyn exam with no complaints today. The patient in 2013 had resectoscopic polypectomy and has had normal cycles. She is taking prenatal vitamins.patient denies any past history of abnormal Pap smears. Her primary physician has recently done her lab work. She is not certain of her Tdap status but will check. She is interested in getting pregnant.  Past medical history,surgical history, family history and social history were all reviewed and documented in the EPIC chart.  Gynecologic History Patient's last menstrual period was 03/12/2013. Contraception: none Last Pap: several years ago. Results were: normal Last mammogram: not indicated. Results were: not indicated  Obstetric History OB History   Grav Para Term Preterm Abortions TAB SAB Ect Mult Living   0                ROS: A ROS was performed and pertinent positives and negatives are included in the history.  GENERAL: No fevers or chills. HEENT: No change in vision, no earache, sore throat or sinus congestion. NECK: No pain or stiffness. CARDIOVASCULAR: No chest pain or pressure. No palpitations. PULMONARY: No shortness of breath, cough or wheeze. GASTROINTESTINAL: No abdominal pain, nausea, vomiting or diarrhea, melena or bright red blood per rectum. GENITOURINARY: No urinary frequency, urgency, hesitancy or dysuria. MUSCULOSKELETAL: No joint or muscle pain, no back pain, no recent trauma. DERMATOLOGIC: No rash, no itching, no lesions. ENDOCRINE: No polyuria, polydipsia, no heat or cold intolerance. No recent change in weight. HEMATOLOGICAL: No anemia or easy bruising or bleeding. NEUROLOGIC: No headache, seizures, numbness, tingling or weakness. PSYCHIATRIC: No depression, no loss of interest in normal activity or change in sleep pattern.     Exam: chaperone present  BP 126/78  Ht 5' 2.25" (1.581 m)  Wt 202 lb (91.627 kg)  BMI 36.66 kg/m2  LMP  03/12/2013  Body mass index is 36.66 kg/(m^2).  General appearance : Well developed well nourished female. No acute distress HEENT: Neck supple, trachea midline, no carotid bruits, no thyroidmegaly Lungs: Clear to auscultation, no rhonchi or wheezes, or rib retractions  Heart: Regular rate and rhythm, no murmurs or gallops Breast:Examined in sitting and supine position were symmetrical in appearance, no palpable masses or tenderness,  no skin retraction, no nipple inversion, no nipple discharge, no skin discoloration, no axillary or supraclavicular lymphadenopathy Abdomen: no palpable masses or tenderness, no rebound or guarding Extremities: no edema or skin discoloration or tenderness  Pelvic:  Bartholin, Urethra, Skene Glands: Within normal limits             Vagina: No gross lesions or discharge  Cervix: No gross lesions or discharge  Uterus  anteverted, normal size, shape and consistency, non-tender and mobile  Adnexa  Without masses or tenderness  Anus and perineum  normal   Rectovaginal  normal sphincter tone without palpated masses or tenderness             Hemoccult that indicated     Assessment/Plan:  32 y.o. female for annual exam who has not been using any form of contraception and is interested in getting pregnant this year. We're going to do an HSG with her upcoming cycle. She will contact the office at the start of her menses. She will be placed on Vibramycin 100 mg twice a day starting the day before the procedure for prophylaxis. I've asked her for the next 6 months to time or intercourse she is in the ovulation predictor  kit and to maintain menstrual calendar as well as the days of her menses. If she does not conceive she'll return back to the office for further evaluation testing to include husband semen analysis. She will check with her primary physician if her Tdap status. Literature information on all the above was provided. She was reminded to do her monthly self breast  examination. New Pap smear guidelines discussed. Pap smear with HPV screen was done today.    Ok Edwards MD, 9:13 AM 04/13/2013

## 2013-12-07 DIAGNOSIS — IMO0002 Reserved for concepts with insufficient information to code with codable children: Secondary | ICD-10-CM | POA: Insufficient documentation

## 2014-06-22 ENCOUNTER — Ambulatory Visit (INDEPENDENT_AMBULATORY_CARE_PROVIDER_SITE_OTHER): Payer: 59 | Admitting: Gynecology

## 2014-06-22 ENCOUNTER — Encounter: Payer: Self-pay | Admitting: Gynecology

## 2014-06-22 VITALS — BP 134/90 | Ht 62.5 in | Wt 194.0 lb

## 2014-06-22 DIAGNOSIS — Z01419 Encounter for gynecological examination (general) (routine) without abnormal findings: Secondary | ICD-10-CM

## 2014-06-22 DIAGNOSIS — N970 Female infertility associated with anovulation: Secondary | ICD-10-CM

## 2014-06-22 LAB — CBC WITH DIFFERENTIAL/PLATELET
BASOS ABS: 0 10*3/uL (ref 0.0–0.1)
Basophils Relative: 0 % (ref 0–1)
Eosinophils Absolute: 0.1 10*3/uL (ref 0.0–0.7)
Eosinophils Relative: 1 % (ref 0–5)
HEMATOCRIT: 38.2 % (ref 36.0–46.0)
HEMOGLOBIN: 13.1 g/dL (ref 12.0–15.0)
LYMPHS PCT: 33 % (ref 12–46)
Lymphs Abs: 2.3 10*3/uL (ref 0.7–4.0)
MCH: 28.5 pg (ref 26.0–34.0)
MCHC: 34.3 g/dL (ref 30.0–36.0)
MCV: 83.2 fL (ref 78.0–100.0)
MONO ABS: 0.3 10*3/uL (ref 0.1–1.0)
MONOS PCT: 4 % (ref 3–12)
NEUTROS ABS: 4.3 10*3/uL (ref 1.7–7.7)
Neutrophils Relative %: 62 % (ref 43–77)
Platelets: 329 10*3/uL (ref 150–400)
RBC: 4.59 MIL/uL (ref 3.87–5.11)
RDW: 13.3 % (ref 11.5–15.5)
WBC: 7 10*3/uL (ref 4.0–10.5)

## 2014-06-22 LAB — LIPID PANEL
Cholesterol: 214 mg/dL — ABNORMAL HIGH (ref 0–200)
HDL: 45 mg/dL (ref >39)
LDL CALC: 133 mg/dL — AB (ref 0–99)
Total CHOL/HDL Ratio: 4.8 Ratio
Triglycerides: 178 mg/dL — ABNORMAL HIGH (ref <150)
VLDL: 36 mg/dL (ref 0–40)

## 2014-06-22 LAB — COMPREHENSIVE METABOLIC PANEL
ALBUMIN: 4.4 g/dL (ref 3.5–5.2)
ALT: 22 U/L (ref 0–35)
AST: 18 U/L (ref 0–37)
Alkaline Phosphatase: 68 U/L (ref 39–117)
BUN: 9 mg/dL (ref 6–23)
CALCIUM: 9.3 mg/dL (ref 8.4–10.5)
CHLORIDE: 104 meq/L (ref 96–112)
CO2: 25 mEq/L (ref 19–32)
Creat: 0.71 mg/dL (ref 0.50–1.10)
Glucose, Bld: 88 mg/dL (ref 70–99)
POTASSIUM: 4.4 meq/L (ref 3.5–5.3)
Sodium: 140 mEq/L (ref 135–145)
Total Bilirubin: 0.3 mg/dL (ref 0.2–1.2)
Total Protein: 7.2 g/dL (ref 6.0–8.3)

## 2014-06-22 LAB — TSH: TSH: 1.423 u[IU]/mL (ref 0.350–4.500)

## 2014-06-22 LAB — PROGESTERONE

## 2014-06-22 MED ORDER — DOXYCYCLINE HYCLATE 100 MG PO CAPS: 100.0000 mg | ORAL_CAPSULE | Freq: Two times a day (BID) | ORAL | Status: DC

## 2014-06-22 NOTE — Patient Instructions (Signed)
Anlisis de semen (Semen Analysis) Este anlisis se [place] para obtener informacin sobre la salud de sus rganos reproductores, en especial si su pareja tiene problemas para quedar embarazada, o despus de una vasectoma para determinar si la operacin result exitosa. El semen es la sustancia turbia y blancuzca que se libera del pene durante la eyaculacin. Los espermatozoides son clulas del semen que tienen una cabeza y una cola que les permite desplazarse hasta el vulo. Un espermatozoide contiene una copia de cada cromosoma (todos los genes masculinos) y se fusiona con el vulo femenino, y se produce la fertilizacin.  PREPARACIN PARA EL ESTUDIO Se recolectar Truddie Coco de semen en un recipiente estril de boca ancha provisto por el laboratorio. HALLAZGOS NORMALES  Volumen: 2 a 33ml  Tiempo de licuefaccin: 20 a 26OMBTDHR despus de la recoleccin  pH: 7,12 a 8,00  Recuento de espermatozoides (densidad): 50 a 283millones/ml  Movilidad de los espermatozoides: 60% al 80% con movilidad activa  Morfologa de los espermatozoides: 70% al 90% con forma normal Los rangos para los resultados normales pueden variar entre diferentes laboratorios y hospitales. Consulte siempre con su mdico despus de Psychologist, counselling estudio para Armed forces logistics/support/administrative officer significado de los [place] y si los valores se consideran "dentro de los lmites normales". SIGNIFICADO DEL ESTUDIO  El mdico leer los resultados y Electrical engineer con usted sobre la importancia y el significado de los [place], as como de las opciones de tratamiento y la necesidad de Optometrist pruebas adicionales, si fuera necesario. OBTENCIN DE LOS RESULTADOS DE LAS PRUEBAS Es su responsabilidad retirar el resultado del [place]. Consulte en el laboratorio cundo y cmo podr [employer]. Document Released: 09/16/2013 [facility] Patient Information 2015 [place]. This information is not intended to replace advice given to you by your health care  provider. Make sure you discuss any questions you have with your health care provider. Histerosalpingografa (Hysterosalpingography) La histerosalpingografa es un procedimiento para observar el interior del tero y las trompas de [place]. Durante este procedimiento, se inyecta una sustancia de contraste en el tero, a travs de la vagina y el cuello del tero para poder visualizar el tero mientras se toman imgenes radiogrficas. Este procedimiento puede ayudar al mdico a diagnosticar tumores, adherencias o anormalidades estructurales en el tero. Generalmente, se [place] para determinar las razones por las que una mujer no puede tener hijos (infertilidad). El procedimiento suele durar entre 15 y 65 minutos. INFORME A SU MDICO:  Cualquier alergia que tenga.  Todos los [employer], incluidos vitaminas, hierbas, gotas oftlmicas, cremas y medicamentos de venta libre.  Problemas previos que usted o los [employer] de su familia hayan tenido con el uso de anestsicos.  Enfermedades de [employer].  Cirugas previas.  Afecciones mdicas que tenga. RIESGOS Y COMPLICACIONES  En general, se trata de un procedimiento seguro. Sin embargo, Engineer, technical sales, pueden surgir problemas. Estos son algunos posibles problemas:  Infeccin en el revestimiento interior del tero (endometritis) o en las trompas de [place] (salpingitis).  [place] trompas de [place].  Reaccin alrgica a la sustancia de [place] para tomar la radiografa. ANTES DEL PROCEDIMIENTO   Debe planificar el procedimiento para despus que finalice su perodo, pero antes de su prxima ovulacin. Esto ocurre por lo general [employer] 5 y 51 de su ltimo perodo. El Da 1 es Engineer, manufacturing systems de su perodo.  Consulte a su mdico si debe cambiar o suspender los medicamentos que toma habitualmente.  Podr comer  y beber normalmente.  Antes de comenzar el procedimiento, vace  la vejiga. PROCEDIMIENTO  Es posible que le administren un medicamento para Nurse, children's (sedante) o un analgsico de venta libre para Public house manager las molestias durante el procedimiento.  Deber acostarse en una mesa de radiografas con los pies en los estribos.  Le colocarn en la vagina un dispositivo llamado espculo. Esto permite al mdico observar el interior de la vagina hasta el cuello del tero.  El cuello del tero se lava con un jabn especial.  Luego se pasa un tubo delgado y flexible a travs del cuello del tero hasta el tero.  Por el tubo se colocar una sustancia de [employer].  Le tomarn varias radiografas a medida que el contraste pasa a travs del tero y las trompas de [place].  Despus del procedimiento se retira el tubo. DESPUS DEL PROCEDIMIENTO   La mayor parte de la sustancia de contraste se eliminar de la vagina naturalmente. Puede ser necesario que use un apsito sanitario.  Puede sentir clicos leves y notar una hemorragia vaginal leve. Luego de 24 horas deben desaparecer.  Pregunte cundo [employer]. Asegrese de [employer]. Document Released: 02/18/2012 Document Revised: 12/01/2013 [facility] Patient Information 2015 [place]. This information is not intended to replace advice given to you by your health care provider. Make sure you discuss any questions you have with your health care provider.

## 2014-06-22 NOTE — Progress Notes (Signed)
Becky Everett [birth year] [id number]   History:    33 y.o.  for annual gyn exam with no complaints today.The patient in 2013 had resectoscopic polypectomy and has had normal cycles. She is taking prenatal vitamins.patient denies any past history of abnormal Pap smears. Patient has had primary infertility and tonsils appear that she could be anovulatory due to her weight. She stated that she did not have a menstrual cycle in May but this cycle in June was 5 days and normal in that in July she hasn't had one as of yet. She is not using any form of contraception. Last year she was to schedule an HSG as well a semen analysis and never came around to it. She would like to proceed. Patient with no prior history of any abnormal Pap smear.   Past medical history,surgical history, family history and social history were all reviewed and documented in the EPIC chart.  Gynecologic History Patient's last menstrual period was 06/04/2014. Contraception: none Last Pap: 2014. Results were: normal Last mammogram: Not indicated. Results were: Not indicated  Obstetric History OB History  Gravida Para Term Preterm AB SAB TAB Ectopic Multiple Living  0                  ROS: A ROS was performed and pertinent positives and negatives are included in the history.  GENERAL: No fevers or chills. HEENT: No change in vision, no earache, sore throat or sinus congestion. NECK: No pain or stiffness. CARDIOVASCULAR: No chest pain or pressure. No palpitations. PULMONARY: No shortness of breath, cough or wheeze. GASTROINTESTINAL: No abdominal pain, nausea, vomiting or diarrhea, melena or bright red blood per rectum. GENITOURINARY: No urinary frequency, urgency, hesitancy or dysuria. MUSCULOSKELETAL: No joint or muscle pain, no back pain, no recent trauma. DERMATOLOGIC: No rash, no itching, no lesions. ENDOCRINE: No polyuria, polydipsia, no heat or cold intolerance. No recent change in weight. HEMATOLOGICAL: No  anemia or easy bruising or bleeding. NEUROLOGIC: No headache, seizures, numbness, tingling or weakness. PSYCHIATRIC: No depression, no loss of interest in normal activity or change in sleep pattern.     Exam: chaperone present  BP 134/90  Ht 5' 2.5" (1.588 m)  Wt 194 lb (87.998 kg)  BMI 34.90 kg/m2  LMP 06/04/2014  Body mass index is 34.9 kg/(m^2).  General appearance : Well developed well nourished female. No acute distress HEENT: Neck supple, trachea midline, no carotid bruits, no thyroidmegaly Lungs: Clear to auscultation, no rhonchi or wheezes, or rib retractions  Heart: Regular rate and rhythm, no murmurs or gallops Breast:Examined in sitting and supine position were symmetrical in appearance, no palpable masses or tenderness,  no skin retraction, no nipple inversion, no nipple discharge, no skin discoloration, no axillary or supraclavicular lymphadenopathy Abdomen: no palpable masses or tenderness, no rebound or guarding Extremities: no edema or skin discoloration or tenderness  Pelvic:  Bartholin, Urethra, Skene Glands: Within normal limits             Vagina: No gross lesions or discharge  Cervix: No gross lesions or discharge  Uterus  anteverted, normal size, shape and consistency, non-tender and mobile  Adnexa  Without masses or tenderness  Anus and perineum  normal   Rectovaginal  normal sphincter tone without palpated masses or tenderness             Hemoccult that indicated     Assessment/Plan:  33 y.o. female for annual exam with primary infertility and apparent episodes of anovulation probably  extruded to her being overweight with a BMI of 34.92. Discussed importance of weight loss at least a 10% to help with her ovulation to be more regular. Today she is on day 22 of her cycle so we will proceeding check a progesterone level along with the following labs: CBC, fasting lipid profile, comprehensive metabolic panel, TSH  And U/A. Pap smear not done. We will schedule HSG  and SA then consult to see me. Prescription for vibramycin was provided (100 mg) one PO BID for three days starting day before HSG. She should continue her prenatal vitamins. Instructions in Spanish provided.  Note: This dictation was prepared with  Dragon/digital dictation along withSmart phrase technology. Any transcriptional errors that result from this process are unintentional.   Terrance Mass MD, 8:53 AM 06/22/2014

## 2014-06-23 ENCOUNTER — Other Ambulatory Visit: Payer: Self-pay | Admitting: Gynecology

## 2014-06-23 DIAGNOSIS — E7800 Pure hypercholesterolemia, unspecified: Secondary | ICD-10-CM

## 2014-06-23 LAB — URINALYSIS W MICROSCOPIC + REFLEX CULTURE
BACTERIA UA: NONE SEEN
Casts: NONE SEEN
Crystals: NONE SEEN
GLUCOSE, UA: NEGATIVE mg/dL
HGB URINE DIPSTICK: NEGATIVE
Ketones, ur: NEGATIVE mg/dL
Leukocytes, UA: NEGATIVE
Nitrite: NEGATIVE
PROTEIN: NEGATIVE mg/dL
Specific Gravity, Urine: 1.024 (ref 1.005–1.030)
UROBILINOGEN UA: 1 mg/dL (ref 0.0–1.0)
pH: 6.5 (ref 5.0–8.0)

## 2014-07-19 ENCOUNTER — Telehealth: Payer: Self-pay | Admitting: *Deleted

## 2014-07-19 DIAGNOSIS — N979 Female infertility, unspecified: Secondary | ICD-10-CM

## 2014-07-19 NOTE — Telephone Encounter (Signed)
Appointment on 07/20/14 @ 8:30 am at [facility] hospital, no sex, take mediation prescribed. Rosemarie Ax will inform patient.

## 2014-07-19 NOTE — Telephone Encounter (Signed)
Message copied by Thamas Jaegers on Mon Jul 19, 2014  8:44 AM ------      Message from: Sinclair Grooms      Created: Fri Jul 16, 2014 12:06 PM      Regarding: hsg       Please schedule HSG for JF patient.  LMP 07-13-14 she prefers mornings. Thx ------

## 2014-07-20 ENCOUNTER — Inpatient Hospital Stay (HOSPITAL_COMMUNITY): Admission: RE | Admit: 2014-07-20 | Payer: 59 | Source: Ambulatory Visit

## 2014-12-29 ENCOUNTER — Other Ambulatory Visit: Payer: 59

## 2015-06-24 ENCOUNTER — Encounter: Payer: Self-pay | Admitting: Gynecology

## 2015-07-22 ENCOUNTER — Encounter: Payer: Self-pay | Admitting: Gynecology

## 2015-07-22 ENCOUNTER — Ambulatory Visit (INDEPENDENT_AMBULATORY_CARE_PROVIDER_SITE_OTHER): Payer: 59 | Admitting: Gynecology

## 2015-07-22 DIAGNOSIS — N912 Amenorrhea, unspecified: Secondary | ICD-10-CM

## 2015-07-22 DIAGNOSIS — Z3201 Encounter for pregnancy test, result positive: Secondary | ICD-10-CM | POA: Diagnosis not present

## 2015-07-22 LAB — PREGNANCY, URINE: PREG TEST UR: POSITIVE

## 2015-07-22 NOTE — Progress Notes (Signed)
   Patient's a 34 year old who has had history in the past of primary infertility. She presented to the office today stating that her last menstrual period was normal on 06/03/2015. She denies any nausea vomiting some breast tenderness and some mild cramping. She did notice some spotting when she wiped one time last week. In 2013 she had a resectoscopic polypectomy and has done well since then. She was in the process of scheduling HSG and semen analysis but had not done so. She denies any past history of any STDs or any pelvic surgery. And she is on had normal Pap smear.  Exam: Gen. appearance well-developed low nourished female in no acute distress Abdomen: Soft nontender no rebound or guarding Pelvic: Bartholin urethra Skene was within normal limits Vagina: No lesions or discharge Cervix: No lesions or discharge Uterus anteverted 6-8 weeks size nontender Adnexa: No palpable masses or tenderness Rectal exam not done  Urine pregnancy test positive  Assessment/plan: First trimester pregnancy. Patient last menstrual period patient's currently 7 weeks with an estimated date of confinement 03/11/2016. Patient currently on prenatal vitamins. Instructions on what to expect in first trimester was provided. Patient will have a quantitative beta-hCG today and repeat in 70 are as with a follow-up ultrasound next week.

## 2015-07-22 NOTE — Patient Instructions (Signed)
Primer trimestre de Media planner (First Trimester of Pregnancy) El primer trimestre de Media planner se extiende desde la semana1 hasta el final de la semana12 (mes1 al mes3). Una semana despus de que un espermatozoide fecunda un vulo, este se implantar en la pared uterina. Este embrin comenzar a Medical laboratory scientific officer convertirse en un beb. Sus genes y los de su pareja forman el beb. Los genes del varn determinan si ser un nio o una nia. Entre la semana6 y [place], se forman los ojos y [place], y los latidos del corazn pueden verse en la ecografa. Al final de las 12semanas, todos los rganos del beb estn formados.  Ahora que est embarazada, querr hacer todo lo que est a su alcance para tener un beb sano. Dos de las cosas ms importantes son Lucilla Edin buena atencin prenatal y seguir las indicaciones del mdico. La atencin prenatal incluye toda la asistencia mdica que usted recibe antes del nacimiento del beb. Esta ayudar a prevenir, Hydrographic surveyor y tratar cualquier problema durante el embarazo y [place]. CAMBIOS EN EL ORGANISMO Su organismo atraviesa por muchos cambios durante el [place], y estos varan de Ardelia Mems mujer a Theatre manager.   Al principio, puede aumentar o bajar algunos kilos.  Puede tener Higher education careers adviser (nuseas) y vomitar. Si no puede controlar los vmitos, llame al mdico.  Puede cansarse con facilidad.  Es posible que tenga dolores de cabeza que pueden aliviarse con los medicamentos que el mdico le permita tomar.  Puede orinar con mayor frecuencia. El dolor al orinar puede significar que usted tiene una infeccin de la vejiga.  Debido al Gl[place] Brink, puede tener acidez estomacal.  Puede estar estreida, ya que ciertas hormonas enlentecen los movimientos de los msculos que [employer] desechos a travs de los intestinos.  Pueden aparecer hemorroides o abultarse e hincharse las venas (venas varicosas).  Las [employer] pueden empezar a Engineer, site y Scientist, forensic. Los pezones  pueden sobresalir ms, y el tejido que los rodea (areola) tornarse ms oscuro.  Las Production manager y estar sensibles al cepillado y al hilo dental.  Pueden aparecer zonas oscuras o manchas (cloasma, mscara del Media planner) en el rostro que probablemente se atenuarn despus del nacimiento del beb.  Los perodos menstruales se interrumpirn.  Tal vez no tenga apetito.  Puede sentir un fuerte deseo de consumir ciertos alimentos.  Puede tener cambios a Engineer, site a da, por ejemplo, por momentos puede estar emocionada por el Media planner y por otros preocuparse porque algo pueda salir mal con el embarazo o el beb.  Tendr sueos ms vvidos y extraos.  Tal vez haya cambios en el cabello que pueden incluir su engrosamiento, crecimiento rpido y cambios en la textura. A algunas mujeres tambin se les cae el cabello durante o despus del [place], o tienen el cabello seco o fino. Lo ms probable es que el cabello se le normalice despus del nacimiento del beb. QU DEBE ESPERAR EN LAS CONSULTAS PRENATALES Durante una visita prenatal de rutina:  La pesarn para asegurarse de que usted y el beb estn creciendo normalmente.  Le controlarn la presin arterial.  Le medirn el abdomen para controlar el desarrollo del beb.  Se escucharn los latidos cardacos a partir de la semana10 o la12 de embarazo, aproximadamente.  Se analizarn los resultados de los estudios solicitados en visitas anteriores. El mdico puede preguntarle:  Cmo se siente.  Si siente los movimientos del beb.  Si ha tenido [employer], como prdida de lquido, [place], dolores de cabeza intensos o  clicos abdominales.  Si tiene [employer]. Otros estudios que pueden realizarse durante el primer trimestre incluyen lo siguiente:  Anlisis de sangre para determinar el tipo de sangre y Hydrographic surveyor la presencia de infecciones previas. Adems, se los usar para controlar si los niveles de hierro  son bajos (anemia) y Teacher, adult education los anticuerpos Rh. En una etapa ms avanzada del [place], se harn anlisis de sangre para saber si tiene diabetes, junto con otros estudios si surgen problemas.  Anlisis de orina para detectar infecciones, diabetes o protenas en la orina.  Una ecografa para confirmar que el beb crece y se desarrolla correctamente.  Una amniocentesis para diagnosticar posibles problemas genticos.  Estudios del feto para descartar espina bfida y sndrome de [place].  Es posible que necesite otras pruebas adicionales. INSTRUCCIONES PARA EL CUIDADO EN EL HOGAR  Medicamentos:  Siga las indicaciones del mdico en relacin con el uso de medicamentos. Durante el embarazo, hay medicamentos que pueden tomarse y 50 que no.  Tome las vitaminas prenatales como se le indic.  Si est estreida, tome un laxante suave, si el mdico lo [place]. Dieta  Consuma alimentos balanceados. Elija alimentos variados, como carne o protenas de origen vegetal, pescado, leche y productos lcteos descremados, verduras, frutas y panes y Psychologist, prison and probation services. El mdico la ayudar a Office manager cantidad de peso que puede [place].  No coma carne cruda ni quesos sin cocinar. Estos elementos contienen bacterias que pueden causar defectos congnitos en el beb.  La ingesta diaria de cuatro o cinco comidas pequeas en lugar de tres comidas abundantes puede ayudar a [employer] nuseas y los vmitos. Si empieza a tener nuseas, comer algunas galletas saladas puede ser de [place]. Beber lquidos [employer] comidas en lugar de tomarlos durante las comidas tambin puede ayudar a Actor las nuseas y los vmitos.  Si est estreida, consuma alimentos con alto contenido de fibra, como verduras y frutas frescas, y Psychologist, prison and probation services. Beba suficiente lquido para Consulting civil engineer orina clara o de color amarillo plido. Actividad y Conservation officer, historic buildings ejercicio solamente como se lo haya indicado el mdico. El  ejercicio la ayudar a:  Technical sales engineer.  Mantenerse en forma.  Estar preparada para el trabajo de parto y Farmer.  Los dolores, los clicos en la parte baja del abdomen o los calambres en la cintura son un buen indicio de que debe dejar de Insurance risk surveyor. Consulte al mdico antes de seguir haciendo ejercicios normales.  Intente no estar de pie [employer]. Mueva las piernas con frecuencia si debe estar de pie en un lugar durante mucho tiempo.  Evite levantar pesos [employer].  Use zapatos de tacones bajos y [place].  Puede seguir teniendo [employer], excepto que el mdico le indique lo contrario. Alivio del dolor o las molestias  Use un sostn que le brinde buen soporte si siente dolor a la palpacin [employer].  Dese baos de asiento con agua tibia para Best boy o las molestias causadas por las hemorroides. Use crema antihemorroidal si el mdico se lo permite.  Descanse con las piernas elevadas si tiene calambres o dolor de cintura.  Si tiene venas varicosas en las piernas, use medias de descanso. Eleve los pies durante 70minutos, 3 o 4veces por da. Limite la cantidad de sal en su dieta. Cuidados prenatales  Programe las visitas prenatales para la semana12 de [place]. Generalmente se programan cada mes al principio y se hacen ms frecuentes en los 2 ltimos meses antes  del parto.  Escriba sus preguntas. Llvelas cuando concurra a las visitas prenatales.  Concurra a todas las visitas prenatales como se lo haya indicado el mdico. Seguridad  Colquese el cinturn de seguridad cuando conduzca.  Haga una lista de los nmeros de telfono de Freight forwarder, que [employer]J's nmeros de telfono de familiares, [place], el hospital y los departamentos de polica y bomberos. Consejos generales  Pdale al mdico que la derive a clases de educacin prenatal en su localidad. Debe comenzar a tomar las clases antes de Dietitian en el mes6  de embarazo.  Pida ayuda si tiene necesidades nutricionales o de asesoramiento Solicitor. El mdico puede aconsejarla o derivarla a especialistas para que la ayuden con diferentes necesidades.  No se d baos de inmersin en agua caliente, baos turcos ni saunas.  No se haga duchas vaginales ni use tampones o toallas higinicas perfumadas.  No mantenga las piernas cruzadas durante mucho tiempo.  Evite el contacto con las bandejas sanitarias de los gatos y la tierra que estos animales usan. Estos elementos contienen bacterias que pueden causar defectos congnitos al beb y la posible prdida del feto debido a un aborto espontneo o muerte fetal.  No fume, no consuma hierbas ni medicamentos que no hayan sido recetados por el mdico. Las sustancias qumicas que estos productos contienen afectan la formacin y el desarrollo del beb.  Programe una cita con el dentista. En su casa, lvese los dientes con un cepillo dental blando y psese el hilo dental con suavidad. SOLICITE ATENCIN MDICA SI:   Tiene mareos.  Siente clicos leves, presin en la pelvis o dolor persistente en el abdomen.  Tiene nuseas, vmitos o diarrea persistentes.  Tiene secrecin vaginal con mal olor.  Siente dolor al [employer].  Tiene el rostro, las [place], las piernas o los tobillos ms hinchados. SOLICITE ATENCIN MDICA DE INMEDIATO SI:   Tiene fiebre.  Tiene una prdida de lquido por la vagina.  Tiene sangrado o pequeas prdidas vaginales.  Siente dolor intenso o clicos en el abdomen.  Sube o baja de peso rpidamente.  Vomita sangre de color rojo brillante o material que parezca granos de caf.  Ha estado expuesta a la rubola y no ha sufrido la enfermedad.  Ha estado expuesta a la quinta enfermedad o a la varicela.  Tiene un dolor de cabeza intenso.  Le falta el aire.  Sufre cualquier tipo de traumatismo, por ejemplo, debido a una cada o un accidente automovilstico. Document  Released: 09/05/2005 Document Revised: 04/12/2014 [facility] Patient Information 2015 [place], [place]. This information is not intended to replace advice given to you by your health care provider. Make sure you discuss any questions you have with your health care provider.

## 2015-07-23 LAB — HCG, QUANTITATIVE, PREGNANCY: HCG, BETA CHAIN, QUANT, S: 6438.6 m[IU]/mL

## 2015-07-25 ENCOUNTER — Other Ambulatory Visit: Payer: 59

## 2015-07-25 ENCOUNTER — Telehealth: Payer: Self-pay | Admitting: *Deleted

## 2015-07-25 DIAGNOSIS — N912 Amenorrhea, unspecified: Secondary | ICD-10-CM

## 2015-07-25 DIAGNOSIS — Z3201 Encounter for pregnancy test, result positive: Secondary | ICD-10-CM

## 2015-07-25 NOTE — Telephone Encounter (Signed)
Pt is [redacted] weeks pregnant states she noticed some bright red spotting at work today, not a lot spotting. I explained sometimes spotting can occur. Pt 1st pregnant and is nervous, asked if I could relay to you. Please advise

## 2015-07-25 NOTE — Telephone Encounter (Signed)
Quantitative BHCG today or tomorrow with ultrasound if not already scheduled. Refrain from intercourse and strenous activity until then

## 2015-07-25 NOTE — Telephone Encounter (Signed)
Pt had Quantitative done today will come tomorrow for ultrasound at 12:15 check in time, I did relay Refrain from intercourse and strenous activity until then.

## 2015-07-26 ENCOUNTER — Other Ambulatory Visit: Payer: Self-pay | Admitting: Gynecology

## 2015-07-26 ENCOUNTER — Encounter: Payer: Self-pay | Admitting: Gynecology

## 2015-07-26 ENCOUNTER — Ambulatory Visit (INDEPENDENT_AMBULATORY_CARE_PROVIDER_SITE_OTHER): Payer: 59

## 2015-07-26 ENCOUNTER — Ambulatory Visit (INDEPENDENT_AMBULATORY_CARE_PROVIDER_SITE_OTHER): Payer: 59 | Admitting: Gynecology

## 2015-07-26 VITALS — BP 122/78

## 2015-07-26 DIAGNOSIS — O468X1 Other antepartum hemorrhage, first trimester: Secondary | ICD-10-CM

## 2015-07-26 DIAGNOSIS — Z3490 Encounter for supervision of normal pregnancy, unspecified, unspecified trimester: Secondary | ICD-10-CM

## 2015-07-26 DIAGNOSIS — O43891 Other placental disorders, first trimester: Secondary | ICD-10-CM

## 2015-07-26 DIAGNOSIS — N912 Amenorrhea, unspecified: Secondary | ICD-10-CM

## 2015-07-26 DIAGNOSIS — Z331 Pregnant state, incidental: Secondary | ICD-10-CM | POA: Diagnosis not present

## 2015-07-26 DIAGNOSIS — O200 Threatened abortion: Secondary | ICD-10-CM

## 2015-07-26 DIAGNOSIS — Z3201 Encounter for pregnancy test, result positive: Secondary | ICD-10-CM

## 2015-07-26 DIAGNOSIS — O209 Hemorrhage in early pregnancy, unspecified: Secondary | ICD-10-CM

## 2015-07-26 DIAGNOSIS — O418X10 Other specified disorders of amniotic fluid and membranes, first trimester, not applicable or unspecified: Secondary | ICD-10-CM

## 2015-07-26 LAB — HCG, QUANTITATIVE, PREGNANCY: hCG, Beta Chain, Quant, S: 9348.9 m[IU]/mL

## 2015-07-26 NOTE — Progress Notes (Signed)
   Patient is a 34 year old gravida 1 para 0 who was seen in the office on August 12 as a result of her amenorrhea was found to be pregnant. Patient with prior history of primary infertility see previous note for additional details. Patient called the office yesterday she was having some vaginal spotting. She denies any cramping, fever, chills, nausea, or vomiting. She was originally scheduled for ultrasound today after serial quantitative beta-hCGs have been ordered.  Her quantitative beta-hCGs as follows: Results for Ann Maki Lenda (MRN [id number]) as of 07/26/2015 13:44  Ref. Range 07/22/2015 15:45 07/25/2015 08:29  HCG, Beta Chain, Quant, S Latest Units: mIU/mL 6438.6 9348.9   Ultrasound today: Based on last menstrual period 06/03/2015 patient will be clinically 7 weeks and 4 days with a due to the aid 03/09/2016. Based on ultrasound today she is currently 5 weeks and 6 days with her corrected due date 03/21/2016  Intrauterine pregnancy was seen in the fundus with a gestational sac with normal shape yoke sac noted. Fetal pole crown-rump length 3.6 mm consistent with 5 weeks and 6 days size less than dates. Cardiac activity 125 bpm was noted. A subchorionic hematoma was noted distal to the gestational sac with a measurement of 22 x 7 x 17 mm. Right ovary was normal left ovary had a small 23 x 19 mm cyst along with a small corpus luteum cyst measuring 27 x 24 mm. Cervix was long and closed.  Assessment/plan: First trimester threatened AB with increasing quantitative beta-hCGs gradually not a complete doubling time. Size less than dates. Subchorionic hematoma. Threatened AB precautions provided. Will follow-up with ultrasound in 2 weeks.

## 2015-07-26 NOTE — Patient Instructions (Signed)
Amenaza de aborto (Threatened Miscarriage) La amenaza de aborto se produce cuando hay hemorragia vaginal durante las primeras 20semanas de [place], pero el embarazo no se interrumpe. Si durante este perodo usted tiene hemorragia vaginal, el mdico le har pruebas para asegurarse de que el embarazo contine. Si las pruebas muestran que usted contina embarazada y que el "beb" en desarrollo (feto) dentro del tero sigue creciendo, se considera que tuvo una [place] de aborto. La amenaza de aborto no implica que el embarazo vaya a Manufacturing engineer, pero s aumenta el riesgo de perder el embarazo (aborto completo). CAUSAS  Por lo general, no se conoce la causa de la amenaza de aborto. Si el resultado final es el aborto completo, la causa ms frecuente es la cantidad anormal de cromosomas del feto. Los cromosomas son las estructuras internas de las clulas que contienen todo Agricultural engineer gentico. Clinical research associate de las causas de hemorragia vaginal que no ocasionan un aborto incluyen:  [place].  [place].  Los cambios hormonales normales durante el [place].  La hemorragia que se produce cuando el vulo se implanta en el tero. FACTORES DE RIESGO Los factores de riesgo de hemorragia al principio del embarazo incluyen:  Obesidad.  Fumar.  El consumo de cantidades excesivas de alcohol o cafena.  El consumo de drogas. SIGNOS Y SNTOMAS  Hemorragia vaginal leve.  Dolor o clicos abdominales leves. DIAGNSTICO  Si tiene hemorragia con o sin dolor abdominal antes de las 20semanas de [place], el mdico le har pruebas para determinar si el embarazo contina. Una prueba importante incluye el uso de ondas sonoras y de una computadora (ecografa) para crear imgenes del interior del tero. Otras pruebas incluyen el examen interno de la vagina y el tero (examen plvico), y el control de la frecuencia cardaca del feto.  Es posible que le diagnostiquen una amenaza de aborto en los  siguientes casos:  La ecografa muestra que el embarazo contina.  La frecuencia cardaca del feto es alta.  El examen plvico muestra que la apertura entre el tero y la vagina (cuello del tero) est cerrada.  Su frecuencia cardaca y su presin arterial estn estables.  Los [employer] de sangre confirman que el embarazo contina. TRATAMIENTO  No se ha demostrado que ningn tratamiento evite que una amenaza de aborto se [place] en un aborto completo. Sin embargo, los cuidados [employer] hogar son importantes.  INSTRUCCIONES PARA EL CUIDADO EN EL HOGAR   Asegrese de asistir a todas las citas de cuidados prenatales. Esto es [employer].  Descanse lo suficiente.  No tenga relaciones sexuales ni use tampones si tiene hemorragia vaginal.  No se haga duchas vaginales.  No fume ni consuma drogas.  No beba alcohol.  Evite la cafena. SOLICITE ATENCIN MDICA SI:  Tiene una ligera hemorragia o manchado vaginal durante el embarazo.  Tiene dolor o clicos en el abdomen.  Tiene fiebre. SOLICITE ATENCIN MDICA DE INMEDIATO SI:  Tiene una hemorragia vaginal abundante.  Elimina cogulos de sangre por la vagina.  Siente dolor en la parte baja de la espalda o clicos abdominales intensos.  Tiene fiebre, escalofros y dolor abdominal intenso. ASEGRESE DE QUE:  Comprende estas instrucciones.  Controlar su afeccin.  Recibir ayuda de inmediato si no mejora o si empeora. Document Released: 09/05/2005 Document Revised: 12/01/2013 [facility] Patient Information 2015 [place]. This information is not intended to replace advice given to you by your health care provider. Make sure you discuss any questions you have with your health care provider.

## 2015-07-27 ENCOUNTER — Ambulatory Visit: Payer: 59 | Admitting: Gynecology

## 2015-07-27 ENCOUNTER — Other Ambulatory Visit: Payer: 59

## 2015-08-03 ENCOUNTER — Encounter: Payer: Self-pay | Admitting: Gynecology

## 2015-08-04 ENCOUNTER — Ambulatory Visit (INDEPENDENT_AMBULATORY_CARE_PROVIDER_SITE_OTHER): Payer: 59 | Admitting: Gynecology

## 2015-08-04 ENCOUNTER — Telehealth: Payer: Self-pay | Admitting: *Deleted

## 2015-08-04 ENCOUNTER — Encounter: Payer: Self-pay | Admitting: Gynecology

## 2015-08-04 VITALS — BP 120/74

## 2015-08-04 DIAGNOSIS — Z331 Pregnant state, incidental: Secondary | ICD-10-CM | POA: Diagnosis not present

## 2015-08-04 DIAGNOSIS — Z3490 Encounter for supervision of normal pregnancy, unspecified, unspecified trimester: Secondary | ICD-10-CM

## 2015-08-04 DIAGNOSIS — M5489 Other dorsalgia: Secondary | ICD-10-CM

## 2015-08-04 DIAGNOSIS — O200 Threatened abortion: Secondary | ICD-10-CM

## 2015-08-04 DIAGNOSIS — N760 Acute vaginitis: Secondary | ICD-10-CM

## 2015-08-04 DIAGNOSIS — N898 Other specified noninflammatory disorders of vagina: Secondary | ICD-10-CM

## 2015-08-04 DIAGNOSIS — N9489 Other specified conditions associated with female genital organs and menstrual cycle: Secondary | ICD-10-CM | POA: Diagnosis not present

## 2015-08-04 DIAGNOSIS — B9689 Other specified bacterial agents as the cause of diseases classified elsewhere: Secondary | ICD-10-CM

## 2015-08-04 DIAGNOSIS — A499 Bacterial infection, unspecified: Secondary | ICD-10-CM

## 2015-08-04 LAB — URINALYSIS W MICROSCOPIC + REFLEX CULTURE
BACTERIA UA: NONE SEEN [HPF]
Bilirubin Urine: NEGATIVE
Casts: NONE SEEN [LPF]
Crystals: NONE SEEN [HPF]
Glucose, UA: NEGATIVE
Ketones, ur: NEGATIVE
LEUKOCYTES UA: NEGATIVE
NITRITE: NEGATIVE
PH: 6.5 (ref 5.0–8.0)
Protein, ur: NEGATIVE
Specific Gravity, Urine: 1.02 (ref 1.001–1.035)
WBC, UA: NONE SEEN WBC/HPF (ref <=5)
YEAST: NONE SEEN [HPF]

## 2015-08-04 LAB — WET PREP FOR TRICH, YEAST, CLUE
CLUE CELLS WET PREP: NONE SEEN
TRICH WET PREP: NONE SEEN
Yeast Wet Prep HPF POC: NONE SEEN

## 2015-08-04 MED ORDER — CLINDAMYCIN PHOSPHATE 2 % VA CREA: TOPICAL_CREAM | VAGINAL | Status: DC

## 2015-08-04 NOTE — Telephone Encounter (Signed)
Pt called c/o possible UTI,with lower back pain, foul smelling urine. pt is pregnant,has ultrasound scheduled on 08/08/15. Asked if okay to wait until Monday, I advised pt best to make OV, transferred to front desk

## 2015-08-04 NOTE — Patient Instructions (Signed)
Clindamycin vaginal cream What is this medicine? CLINDAMYCIN (Luxora sin) is a lincosamide antibiotic. It is used to treat vaginal infections caused by certain bacteria. This medicine may be used for other purposes; ask your health care provider or pharmacist if you have questions. COMMON BRAND NAME(S): Cleocin, ClindaMax, Clindesse What should I tell my health care provider before I take this medicine? They need to know if you have any of these conditions: -diarrhea -inflammatory bowel disease -kidney or liver disease -stomach problems like colitis -an unusual or allergic reaction to clindamycin, lincomycin, other medicines, foods, dyes or preservatives -pregnant or trying to get pregnant -breast-feeding How should I use this medicine? This medicine is only for use in the vagina. Place in the vagina using the special applicator supplied with the cream. Wash hands before and after use. Fill the applicator with cream. Lie on your back, part and bend your knees. Insert the applicator into the vagina and push the plunger to expel the cream into the vagina. Wash the applicator with warm soapy water and rinse well. Keep this medicine out of the eyes. If you do get any in your eyes rinse out with plenty of cool tap water. Take your medicine at regular intervals. Do not take your medicine more often than directed. Use this medicine for the full course prescribed by your doctor or health care professional, even if you think your condition is better. Do not stop using except on your the advice of your doctor or health care professional. Talk to your pediatrician regarding the use of this medicine in children. Special care may be needed. Overdosage: If you think you have taken too much of this medicine contact a poison control center or emergency room at once. NOTE: This medicine is only for you. Do not share this medicine with others. What if I miss a dose? If you miss a dose, use it as soon as you  can. If it is almost time for your next dose, use only that dose. Do not use double or extra doses. What may interact with this medicine? Interactions are not expected. Do not use any other vaginal products without telling your doctor or health care professional. This list may not describe all possible interactions. Give your health care provider a list of all the medicines, herbs, non-prescription drugs, or dietary supplements you use. Also tell them if you smoke, drink alcohol, or use illegal drugs. Some items may interact with your medicine. What should I watch for while using this medicine? Tell your doctor or health care professional if your symptoms do not start to get better in a few days. Do not use tampons or douches while using this medicine. Do not have sex until you have finished your treatment. Having sex can make the treatment less effective. After you finish treatment, do not use latex condoms for three days. There may still be some medicine in the vagina. This can damage the latex and make the condom less effective at preventing pregnancy. Your clothing may get soiled. To help prevent reinfection, wear freshly washed cotton, not synthetic, underwear. What side effects may I notice from receiving this medicine? Side effects that you should report to your doctor or health care professional as soon as possible: -allergic reactions like skin rash, itching or hives, swelling of the face, lips, or tongue -diarrhea that is watery or severe -fever or chills, sore throat -increased thirst -itching of the vaginal or genital area -pain during sexual intercourse -stomach pain or  cramps -thick white vaginal discharge -unusual bleeding or bruising Side effects that usually do not require medical attention (report to your doctor or health care professional if they continue or are bothersome): -nausea, vomiting This list may not describe all possible side effects. Call your doctor for medical  advice about side effects. You may report side effects to FDA at 1-800-FDA-1088. Where should I keep my medicine? Keep out of the reach of children. Store at room temperature between 20 and 25 degrees C (68 and 77 degrees F). Do not freeze. Throw away any unused medicine after the expiration date. NOTE: This sheet is a summary. It may not cover all possible information. If you have questions about this medicine, talk to your doctor, pharmacist, or health care provider.  2015, Elsevier/Gold Standard. (2013-07-02 16:18:30) Bacterial Vaginosis Bacterial vaginosis is a vaginal infection that occurs when the normal balance of bacteria in the vagina is disrupted. It results from an overgrowth of certain bacteria. This is the most common vaginal infection in women of childbearing age. Treatment is important to prevent complications, especially in pregnant women, as it can cause a premature delivery. CAUSES  Bacterial vaginosis is caused by an increase in harmful bacteria that are normally present in smaller amounts in the vagina. Several different kinds of bacteria can cause bacterial vaginosis. However, the reason that the condition develops is not fully understood. RISK FACTORS Certain activities or behaviors can put you at an increased risk of developing bacterial vaginosis, including:  Having a new sex partner or multiple sex partners.  Douching.  Using an intrauterine device (IUD) for contraception. Women do not get bacterial vaginosis from toilet seats, bedding, swimming pools, or contact with objects around them. SIGNS AND SYMPTOMS  Some women with bacterial vaginosis have no signs or symptoms. Common symptoms include:  Grey vaginal discharge.  A fishlike odor with discharge, especially after sexual intercourse.  Itching or burning of the vagina and vulva.  Burning or pain with urination. DIAGNOSIS  Your health care provider will take a medical history and examine the vagina for signs  of bacterial vaginosis. A sample of vaginal fluid may be taken. Your health care provider will look at this sample under a microscope to check for bacteria and abnormal cells. A vaginal pH test may also be done.  TREATMENT  Bacterial vaginosis may be treated with antibiotic medicines. These may be given in the form of a pill or a vaginal cream. A second round of antibiotics may be prescribed if the condition comes back after treatment.  HOME CARE INSTRUCTIONS   Only take over-the-counter or prescription medicines as directed by your health care provider.  If antibiotic medicine was prescribed, take it as directed. Make sure you finish it even if you start to feel better.  Do not have sex until treatment is completed.  Tell all sexual partners that you have a vaginal infection. They should see their health care provider and be treated if they have problems, such as a mild rash or itching.  Practice safe sex by using condoms and only having one sex partner. SEEK MEDICAL CARE IF:   Your symptoms are not improving after 3 days of treatment.  You have increased discharge or pain.  You have a fever. MAKE SURE YOU:   Understand these instructions.  Will watch your condition.  Will get help right away if you are not doing well or get worse. FOR MORE INFORMATION  Centers for Disease Control and Prevention, Division of STD Prevention:  AppraiserFraud.fi American Sexual Health Association (ASHA): www.ashastd.org  Document Released: 11/26/2005 Document Revised: 09/16/2013 Document Reviewed: 07/08/2013 [facility] Patient Information 2015 [place], [place]. This information is not intended to replace advice given to you by your health care provider. Make sure you discuss any questions you have with your health care provider.

## 2015-08-04 NOTE — Progress Notes (Signed)
   Patient is a 34 year old gravida 1 para 0 currently 11-5/[redacted] weeks gestation who presented to the office today concerned that her urine had an odor and with her she had a slight vaginal discharge. Review of her record indicated she was seen in the office on August 16 and her due date had been corrected based on ultrasound. It was noted that time she had a small subchorionic hemorrhage and was diagnosis with threatened abortion and precautions been provided such as abstaining from intercourse or strenuous activity which she has. She is scheduled for follow-up ultrasound at the end of this month. She denied any true dysuria some minimal low back discomfort. She denies any fever, chills, nausea, or vomiting.  Exam: Blood pressure 120/74 Gen. appearance well-developed well-nourished female with above mentioned complaint Back: No CVA tenderness Abdomen: Soft nontender no rebound or guarding Pelvic: Bartholin urethra Skene was within normal limits Vagina: Mild light brown discharge noted Cervix: Cervical os closed no active bleeding Uterus: 10-12 weeks size nontender Adnexa: No palpable mass or tenderness Rectal exam not done  Urinalysis 3-10 RBC otherwise negative  Wet prep moderate amount of bacteria and white blood cells were noted  Assessment/plan: Patient with first trimester pregnancy threatened AB as a result of subchorionic hematoma. Slight broadness discharge probably from resolving hematoma. Wet prep demonstrated possibly rectovaginal vaginosis and for this reason she'll be placed on Cleocin vaginal cream to apply daily at bedtime for 3-5 nights. Patient to follow-up for her ultrasound at the end of the month as previously recommended and to continue with the threatened AB precautions.

## 2015-08-05 LAB — URINE CULTURE
Colony Count: NO GROWTH
Organism ID, Bacteria: NO GROWTH

## 2015-08-08 ENCOUNTER — Ambulatory Visit (INDEPENDENT_AMBULATORY_CARE_PROVIDER_SITE_OTHER): Payer: 59 | Admitting: Gynecology

## 2015-08-08 ENCOUNTER — Ambulatory Visit (INDEPENDENT_AMBULATORY_CARE_PROVIDER_SITE_OTHER): Payer: 59

## 2015-08-08 ENCOUNTER — Encounter: Payer: Self-pay | Admitting: Gynecology

## 2015-08-08 ENCOUNTER — Other Ambulatory Visit: Payer: Self-pay | Admitting: Gynecology

## 2015-08-08 VITALS — BP 128/84

## 2015-08-08 DIAGNOSIS — Z3490 Encounter for supervision of normal pregnancy, unspecified, unspecified trimester: Secondary | ICD-10-CM

## 2015-08-08 DIAGNOSIS — O200 Threatened abortion: Secondary | ICD-10-CM

## 2015-08-08 DIAGNOSIS — O43891 Other placental disorders, first trimester: Secondary | ICD-10-CM

## 2015-08-08 DIAGNOSIS — Q51810 Arcuate uterus: Secondary | ICD-10-CM | POA: Diagnosis not present

## 2015-08-08 DIAGNOSIS — Z331 Pregnant state, incidental: Secondary | ICD-10-CM

## 2015-08-08 NOTE — Progress Notes (Signed)
   Patient is a 34 year old gravida 1 whose last menstrual period was reported to be 06/03/2015 was seen in the office on August 16 to discuss her serial quantitative beta-hCGs which I demonstrated the following: Results for Becky Everett, Becky Everett (MRN [id number]) as of 07/26/2015 13:44  Ref. Range 07/22/2015 15:45 07/25/2015 08:29  HCG, Beta Chain, Quant, S Latest Units: mIU/mL 6438.6 9348.9         Patient had an ultrasound that office visit and it was noted the following:Intrauterine pregnancy was seen in the fundus with a gestational sac with normal shape yoke sac noted. Fetal pole crown-rump length 3.6 mm consistent with 5 weeks and 6 days size less than dates. Cardiac activity 125 bpm was noted. A subchorionic hematoma was noted distal to the gestational sac with a measurement of 22 x 7 x 17 mm. Right ovary was normal left ovary had a small 23 x 19 mm cyst along with a small corpus luteum cyst measuring 27 x 24 mm. Cervix was long and closed. Patient was provided with threatened abortion instructions and was asked to return to the office for follow-up ultrasound which she has today. Patient recently was treated for bacterial vaginosis with Cleocin vaginal cream. Patient is asymptomatic today.  Ultrasound today: Based on ultrasound measurements patient's currently 8 weeks with a new corrected due date of 03/19/2016. Cardiac activity was noted at 166 bpm. No evidence of prior supple chorionic hematoma. Right ovary was normal. Left ovarian corpus luteum cyst was noted. Cervix long closed. No fluid in the cul-de-sac. Normal yolk sac was noted. Arcuate uterus.   Assessment/plan: Patient with viable intrauterine pregnancy first trimester currently [redacted] weeks gestation will be referred to my OB colleagues for prenatal care and delivery. A copy of the ultrasound report was provided. Issue to continue prenatal vitamins.

## 2015-09-01 ENCOUNTER — Inpatient Hospital Stay (HOSPITAL_COMMUNITY)
Admission: AD | Admit: 2015-09-01 | Discharge: 2015-09-01 | Disposition: A | Discharge status: Home / Self Care | Payer: 59 | Source: Ambulatory Visit | Attending: Obstetrics and Gynecology | Admitting: Obstetrics and Gynecology

## 2015-09-01 ENCOUNTER — Encounter (HOSPITAL_COMMUNITY): Payer: Self-pay | Admitting: *Deleted

## 2015-09-01 ENCOUNTER — Inpatient Hospital Stay (HOSPITAL_COMMUNITY): Payer: 59

## 2015-09-01 DIAGNOSIS — O021 Missed abortion: Secondary | ICD-10-CM | POA: Insufficient documentation

## 2015-09-01 DIAGNOSIS — R58 Hemorrhage, not elsewhere classified: Secondary | ICD-10-CM

## 2015-09-01 DIAGNOSIS — R109 Unspecified abdominal pain: Secondary | ICD-10-CM | POA: Diagnosis present

## 2015-09-01 DIAGNOSIS — Z3A12 12 weeks gestation of pregnancy: Secondary | ICD-10-CM | POA: Insufficient documentation

## 2015-09-01 LAB — CBC
HCT: 35.9 % — ABNORMAL LOW (ref 36.0–46.0)
Hemoglobin: 12.1 g/dL (ref 12.0–15.0)
MCH: 29.8 pg (ref 26.0–34.0)
MCHC: 33.7 g/dL (ref 30.0–36.0)
MCV: 88.4 fL (ref 78.0–100.0)
PLATELETS: 311 10*3/uL (ref 150–400)
RBC: 4.06 MIL/uL (ref 3.87–5.11)
RDW: 13.5 % (ref 11.5–15.5)
WBC: 8.2 10*3/uL (ref 4.0–10.5)

## 2015-09-01 LAB — HCG, QUANTITATIVE, PREGNANCY: HCG, BETA CHAIN, QUANT, S: 3635 m[IU]/mL — AB (ref <5)

## 2015-09-01 NOTE — MAU Note (Signed)
Urine in lab 

## 2015-09-01 NOTE — MAU Provider Note (Signed)
Becky Everett is a 34 y.o. G1P0 at 12 weeks presents to MAU after calling the office to c/o of VB and cramping.  She had her NOB interview but is scheduled for her NOB visit tomorrow.   History     Patient Active Problem List   Diagnosis Date Noted  . Subchorionic hematoma in first trimester 07/26/2015  . Abortion, threatened 07/26/2015  . 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  HPI  OB History    Gravida Para Term Preterm AB TAB SAB Ectopic Multiple Living   1               Past Medical History  Diagnosis Date  . Gallstones   . Abdominal pain     right side per patient  . Complication of anesthesia   . Migraines     tx with otc med  . Seasonal allergies     Past Surgical History  Procedure Laterality Date  . Cholecystectomy  03/05/2012    Procedure: LAPAROSCOPIC CHOLECYSTECTOMY WITH INTRAOPERATIVE CHOLANGIOGRAM;  Surgeon: Imogene Burn. Georgette Dover, MD;  Location: [facility];  Service: General;  Laterality: N/A;  . Pelvic laparoscopy      cystectomy    Family History  Problem Relation Age of Onset  . Stroke Mother   . Seizures Mother   . Hypertension Mother   . Diabetes Father   . Heart disease Father     has had two heart attacks    Social History  Substance Use Topics  . Smoking status: Former Smoker -- 0.25 packs/day for 2 years    Types: Cigarettes    Quit date: 01/08/1992  . Smokeless tobacco: Never Used  . Alcohol Use: Yes     Comment: socially - rare    Allergies: No Known Allergies  Prescriptions prior to admission  Medication Sig Dispense Refill Last Dose  . clindamycin (CLEOCIN) 2 % vaginal cream Apply daily at bedtime for 3-4 nights 40 g 0   . Multiple Vitamin (MULTIVITAMIN) tablet Take 1 tablet by mouth daily.   Taking    ROS See HPI above, all other systems are negative  Physical Exam   Last menstrual period 06/03/2015.  Physical Exam Ext:  WNL ABD: Soft,  non tender to palpation, no rebound or guarding SVE:   ED Course  Assessment: IUP at  12 weeks Membranes:questionable FHR: none CTX:  Occasional cramping VE C/T/H VB at the introitus and in the vault   Plan: Labs: CBC, quant [place]    Venus Standard, CNM, MSN 09/01/2015. 9:14 AM   MAU Addendum Note  Results for orders placed or performed during the hospital encounter of 09/01/15 (from the past 24 hour(s))  CBC     Status: Abnormal   Collection Time: 09/01/15  9:30 AM  Result Value Ref Range   WBC 8.2 4.0 - 10.5 K/uL   RBC 4.06 3.87 - 5.11 MIL/uL   Hemoglobin 12.1 12.0 - 15.0 g/dL   HCT 35.9 (L) 36.0 - 46.0 %   MCV 88.4 78.0 - 100.0 fL   MCH 29.8 26.0 - 34.0 pg   MCHC 33.7 30.0 - 36.0 g/dL   RDW 13.5 11.5 - 15.5 %   Platelets 311 150 - 400 K/uL  hCG, quantitative, pregnancy     Status: Abnormal   Collection Time: 09/01/15  9:30 AM  Result Value Ref Range   hCG, Beta Chain, Quant, S 3635 (H) <5 mIU/mL  [place],  Confirmed IUFD at 8.2 weeks   Plan: Pt given 3 options 1) expectant managemnt 2) active management with cytotec 3) D&E   -Pt elected to allowed expectant management -Pt to go to office on Monday at 1045 for a FU with LC -Discussed need to follow up in office for quant -Bleeding Precautions -Encouraged to call if any questions or concerns arise prior to next scheduled office visit.  -Discharged to home in appropriate sad but stable condition    Venus Standard, CNM, MSN 09/01/2015. 11:14 AM

## 2015-09-01 NOTE — MAU Note (Signed)
Pt reprots she started  Having some brown spotting yesterday with cramping. Continues to cramp today and bleeding is more red in color.

## 2015-09-01 NOTE — Discharge Instructions (Signed)
Incomplete Miscarriage °A miscarriage is the sudden loss of an unborn baby (fetus) before the 20th week of pregnancy. In an incomplete miscarriage, parts of the fetus or placenta (afterbirth) remain in the body.  °Having a miscarriage can be an emotional experience. Talk with your health care provider about any questions you may have about miscarrying, the grieving process, and your future pregnancy plans. °CAUSES  °· Problems with the fetal chromosomes that make it impossible for the baby to develop normally. Problems with the baby's genes or chromosomes are most often the result of errors that occur by chance as the embryo divides and grows. The problems are not inherited from the parents. °· Infection of the cervix or uterus. °· Hormone problems. °· Problems with the cervix, such as having an incompetent cervix. This is when the tissue in the cervix is not strong enough to hold the pregnancy. °· Problems with the uterus, such as an abnormally shaped uterus, uterine fibroids, or congenital abnormalities. °· Certain medical conditions. °· Smoking, drinking alcohol, or taking illegal drugs. °· Trauma. °SYMPTOMS  °· Vaginal bleeding or spotting, with or without cramps or pain. °· Pain or cramping in the abdomen or lower back. °· Passing fluid, tissue, or blood clots from the vagina. °DIAGNOSIS  °Your health care provider will perform a physical exam. You may also have an ultrasound to confirm the miscarriage. Blood or urine tests may also be ordered. °TREATMENT  °· Usually, a dilation and curettage (D&C) procedure is performed. During a D&C procedure, the cervix is widened (dilated) and any remaining fetal or placental tissue is gently removed from the uterus. °· Antibiotic medicines are prescribed if there is an infection. Other medicines may be given to reduce the size of the uterus (contract) if there is a lot of bleeding. °· If you have Rh negative blood and your baby was Rh positive, you will need a Rho (D)  immune globulin shot. This shot will protect any future baby from having Rh blood problems in future pregnancies. °· You may be confined to bed rest. This means you should stay in bed and only get up to use the bathroom. °HOME CARE INSTRUCTIONS  °· Rest as directed by your health care provider. °· Restrict activity as directed by your health care provider. You may be allowed to continue light activity if curettage was not done but you require further treatment. °· Keep track of the number of pads you use each day. Keep track of how soaked (saturated) they are. Record this information. °· Do not  use tampons. °· Do not douche or have sexual intercourse until approved by your health care provider. °· Keep all follow-up appointments for reevaluation and continuing management. °· Only take over-the-counter or prescription medicines for pain, fever, or discomfort as directed by your health care provider. °· Take antibiotic medicine as directed by your health care provider. Make sure you finish it even if you start to feel better. °SEEK IMMEDIATE MEDICAL CARE IF:  °· You experience severe cramps in your stomach, back, or abdomen. °· You have an unexplained temperature (make sure to record these temperatures). °· You pass large clots or tissue (save these for your health care provider to inspect). °· Your bleeding increases. °· You become light-headed, weak, or have fainting episodes. °MAKE SURE YOU:  °· Understand these instructions. °· Will watch your condition. °· Will get help right away if you are not doing well or get worse. °Document Released: 11/26/2005 Document Revised: 04/12/2014 Document Reviewed:   06/25/2013 ExitCare Patient Information 2015 [place], [place]. This information is not intended to replace advice given to you by your health care provider. Make sure you discuss any questions you have with your health care provider.  Follow up in office [facility] on Monday at 1045

## 2015-09-01 NOTE — MAU Note (Signed)
Mild cramping today was worse yesterday, started out with brown vaginal bleeding yesterday this morning bright red, ,amount is like the beginning of period.

## 2015-09-02 ENCOUNTER — Ambulatory Visit (HOSPITAL_COMMUNITY): Payer: 59 | Admitting: Anesthesiology

## 2015-09-02 ENCOUNTER — Ambulatory Visit (HOSPITAL_COMMUNITY)
Admission: AD | Admit: 2015-09-02 | Discharge: 2015-09-02 | Disposition: A | Discharge status: Home / Self Care | Payer: 59 | Source: Ambulatory Visit | Attending: Obstetrics & Gynecology | Admitting: Obstetrics & Gynecology

## 2015-09-02 ENCOUNTER — Encounter (HOSPITAL_COMMUNITY): Payer: Self-pay | Admitting: Anesthesiology

## 2015-09-02 ENCOUNTER — Other Ambulatory Visit: Payer: Self-pay | Admitting: Obstetrics & Gynecology

## 2015-09-02 ENCOUNTER — Encounter (HOSPITAL_COMMUNITY)
Admission: AD | Disposition: A | Discharge status: Home / Self Care | Payer: Self-pay | Source: Ambulatory Visit | Attending: Obstetrics & Gynecology

## 2015-09-02 DIAGNOSIS — Z3A08 8 weeks gestation of pregnancy: Secondary | ICD-10-CM | POA: Diagnosis not present

## 2015-09-02 DIAGNOSIS — Z87891 Personal history of nicotine dependence: Secondary | ICD-10-CM | POA: Insufficient documentation

## 2015-09-02 DIAGNOSIS — O021 Missed abortion: Secondary | ICD-10-CM | POA: Insufficient documentation

## 2015-09-02 DIAGNOSIS — G43909 Migraine, unspecified, not intractable, without status migrainosus: Secondary | ICD-10-CM | POA: Insufficient documentation

## 2015-09-02 HISTORY — PX: DILATION AND CURETTAGE OF UTERUS: SHX78

## 2015-09-02 SURGERY — DILATION AND CURETTAGE
Anesthesia: Monitor Anesthesia Care

## 2015-09-02 MED ORDER — ONDANSETRON HCL 4 MG/2ML IJ SOLN
INTRAMUSCULAR | Status: DC | PRN
  Administered 2015-09-02: 4 mg via INTRAVENOUS

## 2015-09-02 MED ORDER — OXYCODONE-ACETAMINOPHEN 5-325 MG PO TABS: 1.0000 | ORAL_TABLET | ORAL | Status: DC | PRN

## 2015-09-02 MED ORDER — PROPOFOL 10 MG/ML IV BOLUS
INTRAVENOUS | Status: AC
  Filled 2015-09-02: qty 40

## 2015-09-02 MED ORDER — DEXAMETHASONE SODIUM PHOSPHATE 4 MG/ML IJ SOLN
INTRAMUSCULAR | Status: DC | PRN
  Administered 2015-09-02: 4 mg via INTRAVENOUS

## 2015-09-02 MED ORDER — DOXYCYCLINE HYCLATE 100 MG IV SOLR
100.0000 mg | Freq: Once | INTRAVENOUS | Status: AC
  Administered 2015-09-02: 100 mg via INTRAVENOUS
  Filled 2015-09-02: qty 100

## 2015-09-02 MED ORDER — IBUPROFEN 800 MG PO TABS: 800.0000 mg | ORAL_TABLET | Freq: Three times a day (TID) | ORAL | Status: DC | PRN

## 2015-09-02 MED ORDER — FENTANYL CITRATE (PF) 100 MCG/2ML IJ SOLN: 25.0000 ug | INTRAMUSCULAR | Status: DC | PRN

## 2015-09-02 MED ORDER — BUPIVACAINE-EPINEPHRINE 0.5% -1:200000 IJ SOLN
INTRAMUSCULAR | Status: DC | PRN
  Administered 2015-09-02: 10 mL

## 2015-09-02 MED ORDER — FENTANYL CITRATE (PF) 100 MCG/2ML IJ SOLN
INTRAMUSCULAR | Status: DC | PRN
  Administered 2015-09-02 (×2): 50 ug via INTRAVENOUS
  Administered 2015-09-02: 100 ug via INTRAVENOUS

## 2015-09-02 MED ORDER — LACTATED RINGERS IV SOLN
INTRAVENOUS | Status: DC
  Administered 2015-09-02 (×2): via INTRAVENOUS

## 2015-09-02 MED ORDER — PROPOFOL 10 MG/ML IV BOLUS
INTRAVENOUS | Status: DC | PRN
  Administered 2015-09-02: 20 mg via INTRAVENOUS
  Administered 2015-09-02: 40 mg via INTRAVENOUS
  Administered 2015-09-02: 30 mg via INTRAVENOUS
  Administered 2015-09-02: 10 mg via INTRAVENOUS

## 2015-09-02 MED ORDER — DOXYCYCLINE HYCLATE 100 MG PO TABS
200.0000 mg | ORAL_TABLET | Freq: Once | ORAL | Status: AC
  Administered 2015-09-02: 200 mg via ORAL

## 2015-09-02 MED ORDER — PROPOFOL 500 MG/50ML IV EMUL
INTRAVENOUS | Status: DC | PRN
  Administered 2015-09-02: 125 ug/kg/min via INTRAVENOUS

## 2015-09-02 MED ORDER — DEXAMETHASONE SODIUM PHOSPHATE 4 MG/ML IJ SOLN
INTRAMUSCULAR | Status: AC
  Filled 2015-09-02: qty 1

## 2015-09-02 MED ORDER — MIDAZOLAM HCL 2 MG/2ML IJ SOLN
INTRAMUSCULAR | Status: AC
  Filled 2015-09-02: qty 4

## 2015-09-02 MED ORDER — FENTANYL CITRATE (PF) 100 MCG/2ML IJ SOLN
INTRAMUSCULAR | Status: AC
  Filled 2015-09-02: qty 4

## 2015-09-02 MED ORDER — KETOROLAC TROMETHAMINE 30 MG/ML IJ SOLN
INTRAMUSCULAR | Status: DC | PRN
  Administered 2015-09-02: 30 mg via INTRAVENOUS

## 2015-09-02 MED ORDER — LIDOCAINE HCL (CARDIAC) 20 MG/ML IV SOLN
INTRAVENOUS | Status: AC
  Filled 2015-09-02: qty 5

## 2015-09-02 MED ORDER — MIDAZOLAM HCL 5 MG/5ML IJ SOLN
INTRAMUSCULAR | Status: DC | PRN
  Administered 2015-09-02: 2 mg via INTRAVENOUS

## 2015-09-02 MED ORDER — PROPOFOL 10 MG/ML IV BOLUS
INTRAVENOUS | Status: AC
  Filled 2015-09-02: qty 20

## 2015-09-02 MED ORDER — ONDANSETRON HCL 4 MG/2ML IJ SOLN
INTRAMUSCULAR | Status: AC
  Filled 2015-09-02: qty 2

## 2015-09-02 MED ORDER — PROMETHAZINE HCL 25 MG/ML IJ SOLN
6.2500 mg | INTRAMUSCULAR | Status: DC | PRN
Start: 2015-09-02 — End: 2015-09-02

## 2015-09-02 MED ORDER — DOXYCYCLINE HYCLATE 100 MG IV SOLR
100.0000 mg | Freq: Two times a day (BID) | INTRAVENOUS | Status: DC
  Filled 2015-09-02 (×4): qty 100

## 2015-09-02 MED ORDER — DOXYCYCLINE HYCLATE 100 MG PO TABS
ORAL_TABLET | ORAL | Status: AC
  Administered 2015-09-02: 200 mg via ORAL
  Filled 2015-09-02: qty 2

## 2015-09-02 MED ORDER — BUPIVACAINE-EPINEPHRINE (PF) 0.5% -1:200000 IJ SOLN
INTRAMUSCULAR | Status: AC
  Filled 2015-09-02: qty 30

## 2015-09-02 SURGICAL SUPPLY — 14 items
CATH ROBINSON RED A/P 16FR (CATHETERS) ×3 IMPLANT
CLOTH BEACON ORANGE TIMEOUT ST (SAFETY) ×3 IMPLANT
CONTAINER PREFILL 10% NBF 60ML (FORM) ×6 IMPLANT
DECANTER SPIKE VIAL GLASS SM (MISCELLANEOUS) ×2 IMPLANT
GLOVE BIOGEL PI IND STRL 7.0 (GLOVE) ×1 IMPLANT
GLOVE BIOGEL PI INDICATOR 7.0 (GLOVE) ×2
GLOVE NEODERM STER SZ 7 (GLOVE) ×4 IMPLANT
GLOVE SURG SS PI 6.5 STRL IVOR (GLOVE) ×6 IMPLANT
GLOVE SURG SS PI 7.0 STRL IVOR (GLOVE) ×8 IMPLANT
GOWN STRL REUS W/TWL LRG LVL3 (GOWN DISPOSABLE) ×8 IMPLANT
PACK VAGINAL MINOR WOMEN LF (CUSTOM PROCEDURE TRAY) ×3 IMPLANT
PAD OB MATERNITY 4.3X12.25 (PERSONAL CARE ITEMS) ×3 IMPLANT
TOWEL OR 17X24 6PK STRL BLUE (TOWEL DISPOSABLE) ×6 IMPLANT
VACURETTE 8 RIGID CVD (CANNULA) ×2 IMPLANT

## 2015-09-02 NOTE — Op Note (Signed)
  Patient's name: Becky Everett, Becky Everett  DOB: 11/18/1981  Date of procedure: 09/02/2015  Preop diagnosis: Missed abortion at [redacted] weeks EGA.  Postop diagnosis: Same as above.  Surgeon: Dr. Waymon Amato.  Assistants: Merchandiser, retail, Suzy Bouchard.  Anesthesia: MAC  Complications: None  Input: 1000cc LR  Output: EBL: 200cc.  Urine: 50cc.   Findings: 8 week size anteverted uterus. No palpable adnexal masses bilaterally. Cervix closed with little blood coming through the os.  Indications: 34 year-old G 1 para 0 who was supposed to be 12 weeks LMP who presented with vaginal bleeding and abdominal cramping. A recent ultrasound showed a missed abortion at [redacted] weeks EGA, without a fetal heartbeat The patient's desired surgical management of the missed abortion. We discussed risks benefits and alternatives of the procedure. She is known blood group A positive  Procedure:  Informed consent was obtained from the patient to undergo the procedure. She was taken to the operating room anesthesia was administered without difficulty. Doxycycline 100 mg IV was given preoperatively.  She was placed in the dorsal lithotomy position and prepped and draped in the usual sterile fashion. Straight catheterization was performed.  A speculum and long Allis clamp was placed on anterior cervix to view the cervix.  The cervix was dilated serially with the [facility] dilators up to #23 dilator. The suction curette #8 curved was then advanced and the uterus cleared of the products of conception with 3 passes of the suction curette.  Gentle sharp curettage was then performed and the 4 quadrants were noted to be gritty.  The suction curette was once again passed into the uterus to clear it of the remaining products of conception,a bout 3 more passes. There was minimal bleeding at the end of this procedure. The instruments were then removed and the patient was then awoken from anesthesia she was cleaned and taken to recovery room in stable  condition  Specimen: Products of conception.

## 2015-09-02 NOTE — Transfer of Care (Signed)
Immediate Anesthesia Transfer of Care Note  Patient: Becky Everett  Procedure(s) Performed: Procedure(s): Suction DILATATION AND CURETTAGE (N/A)  Patient Location: PACU  Anesthesia Type:MAC  Level of Consciousness: awake, alert  and oriented  Airway & Oxygen Therapy: Patient Spontanous Breathing and Patient connected to nasal cannula oxygen  Post-op Assessment: Report given to RN and Post -op Vital signs reviewed and stable  Post vital signs: Reviewed and stable  Last Vitals:  Filed Vitals:   09/02/15 1201  BP: 121/77  Pulse:   Temp:   Resp:     Complications: No apparent anesthesia complications

## 2015-09-02 NOTE — Interval H&P Note (Signed)
History and Physical Interval Note:  09/02/2015 1:15 PM  Becky Everett  has presented today for surgery, with the diagnosis of Missed AB  The various methods of treatment have been discussed with the patient and family. After consideration of risks, benefits and other options for treatment, the patient has consented to  Procedure(s): Suction DILATATION AND CURETTAGE (N/A) as a surgical intervention .  The patient's history has been reviewed, patient examined, no change in status, stable for surgery.  I have reviewed the patient's chart and labs.  Questions were answered to the patient's satisfaction.     [facility]

## 2015-09-02 NOTE — Brief Op Note (Signed)
09/02/2015  4:13 PM  PATIENT:  Becky Everett  34 y.o. female  PRE-OPERATIVE DIAGNOSIS:  Missed AB  POST-OPERATIVE DIAGNOSIS:  Missed AB  PROCEDURE:  Procedure(s): Suction DILATATION AND CURETTAGE (N/A)  SURGEON:  Surgeon(s) and Role:    * Waymon Amato, MD - Primary  PHYSICIAN ASSISTANT:   ASSISTANTS: none   ANESTHESIA:   MAC  EBL:  Total I/O In: 1400 [I.V.:1400] Out: 250 [Urine:50; Blood:200]  BLOOD ADMINISTERED:none  DRAINS: none   LOCAL MEDICATIONS USED:  BUPIVICAINE  With epinephrine  SPECIMEN:  Source of Specimen:  Products of conception  DISPOSITION OF SPECIMEN:  PATHOLOGY  COUNTS:  YES  TOURNIQUET:  * No tourniquets in log *  DICTATION: .Dragon Dictation  PLAN OF CARE: Discharge to home after PACU  PATIENT DISPOSITION:  PACU - hemodynamically stable.   Delay start of Pharmacological VTE agent (>24hrs) due to surgical blood loss or risk of bleeding: not applicable

## 2015-09-02 NOTE — Anesthesia Preprocedure Evaluation (Addendum)
Anesthesia Evaluation  Patient identified by MRN, date of birth, ID band Patient awake    Reviewed: Allergy & Precautions, NPO status , Patient's Chart, lab work & pertinent test results  History of Anesthesia Complications Negative for: history of anesthetic complications  Airway Mallampati: II  TM Distance: >3 FB Neck ROM: Full    Dental  (+) Teeth Intact, Dental Advisory Given   Pulmonary former smoker,    Pulmonary exam normal breath sounds clear to auscultation       Cardiovascular Exercise Tolerance: Good (-) hypertensionnegative cardio ROS Normal cardiovascular exam Rhythm:Regular Rate:Normal     Neuro/Psych  Headaches,    GI/Hepatic negative GI ROS, Neg liver ROS,   Endo/Other  Obesity   Renal/GU negative Renal ROS     Musculoskeletal negative musculoskeletal ROS (+)   Abdominal   Peds  Hematology negative hematology ROS (+)   Anesthesia Other Findings Day of surgery medications reviewed with the patient.  Reproductive/Obstetrics (+) Pregnancy Missed Abortion                             Anesthesia Physical Anesthesia Plan  ASA: II  Anesthesia Plan: MAC   Post-op Pain Management:    Induction: Intravenous  Airway Management Planned: Nasal Cannula  Additional Equipment:   Intra-op Plan:   Post-operative Plan:   Informed Consent: I have reviewed the patients History and Physical, chart, labs and discussed the procedure including the risks, benefits and alternatives for the proposed anesthesia with the patient or authorized representative who has indicated his/her understanding and acceptance.   Dental advisory given  Plan Discussed with: CRNA and Anesthesiologist  Anesthesia Plan Comments: (Discussed risks/benefits/alternatives to MAC sedation including need for ventilatory support, hypotension, need for conversion to general anesthesia.  All patient questions  answered.  Patient wished to proceed.)        Anesthesia Quick Evaluation

## 2015-09-02 NOTE — Progress Notes (Signed)
Per Dr. Mikey Bussing pt doesn't need blood type done. Pt is a+

## 2015-09-02 NOTE — H&P (Signed)
Becky Everett is an 34 y.o. female G1P0 with missed abortion at [redacted] weeks ega (confirmed by recent ultrasound) here for suction dilation and currettage.   Pertinent Gynecological History: Menses: 06/03/2015 Bleeding: small amount Contraception: none DES exposure: unknown Blood transfusions: none Sexually transmitted diseases: no past history Previous GYN Procedures: lap ovarian cystectomy  Last mammogram:  Last pap:    Menstrual History: Patient's last menstrual period was 06/03/2015.    Past Medical History  Diagnosis Date  . Gallstones   . Abdominal pain     right side per patient  . Migraines     tx with otc med    Past Surgical History  Procedure Laterality Date  . Cholecystectomy  03/05/2012    Procedure: LAPAROSCOPIC CHOLECYSTECTOMY WITH INTRAOPERATIVE CHOLANGIOGRAM;  Surgeon: Imogene Burn. Georgette Dover, MD;  Location: [facility];  Service: General;  Laterality: N/A;  . Pelvic laparoscopy      cystectomy    Family History  Problem Relation Age of Onset  . Stroke Mother   . Seizures Mother   . Hypertension Mother   . Diabetes Father   . Heart disease Father     has had two heart attacks    Social History:  reports that she quit smoking about 23 years ago. Her smoking use included Cigarettes. She has a .5 pack-year smoking history. She has never used smokeless tobacco. She reports that she drinks alcohol. She reports that she does not use illicit drugs.  Allergies: No Known Allergies  Prescriptions prior to admission  Medication Sig Dispense Refill Last Dose  . acetaminophen (TYLENOL) 325 MG tablet Take 650 mg by mouth every 6 (six) hours as needed for headache.   09/01/2015 at Unknown time  . Prenatal Vit-Fe Fumarate-FA (PRENATAL MULTIVITAMIN) TABS tablet Take 1 tablet by mouth daily at 12 noon.   Past Week at Unknown time  . clindamycin (CLEOCIN) 2 % vaginal cream Apply daily at bedtime for 3-4 nights (Patient not taking: Reported on 09/02/2015) 40 g 0 Past Month at Unknown  time    ROS   GEN: Denies fevers/chills Cardiovascular: denies chest pain, shortness of breath. Pulmonary: denies  coughing or wheezing Gastrointestinal: denies nausea, vomiting   Blood pressure 121/77, pulse 105, temperature 98.2 F (36.8 C), temperature source Oral, resp. rate 20, last menstrual period 06/03/2015, SpO2 100 %. Physical Exam Gen: NAD CVS: s1, s2, RRR Pum: CTAB Abd: Soft, Non tender Ext: warm and well perfused  No results found for this or any previous visit (from the past 24 hour(s)).  US Ob Transvaginal  09/01/2015   CLINICAL DATA:  Vaginal bleeding, positive pregnancy test. Followup.  EXAM: TRANSVAGINAL OB ULTRASOUND  TECHNIQUE: Transvaginal ultrasound was performed for complete evaluation of the gestation as well as the maternal uterus, adnexal regions, and pelvic cul-de-sac.  COMPARISON:  08/08/2015 and 07/26/2015 exam at [facility], images are available but no report  FINDINGS: Intrauterine gestational sac: Visualized/normal in shape.  Yolk sac:  Not visualized  Embryo:  Visualized  Cardiac Activity: Not visualized  Heart Rate: 0 bpm  CRL:   18  mm   8 w 2d                  [place] EDC: 04/09/16  Maternal uterus/adnexae: Within the left ovary is an oval echogenic area measuring 1.2 x 1.0 x 1.1 cm. This is not visualized on the provided outside images. Right ovary is normal.  IMPRESSION: Interval intrauterine fetal demise at 8 weeks 2 days.  Echogenic  area within the left ovary, not seen on the provided outside images. This may represent an involuting cyst given apparent absence on prior outside exam, although a small dermoid could be similar in appearance.  These results will be called to the ordering clinician or representative by the Radiologist Assistant, and communication documented in the PACS or zVision Dashboard.   Electronically Signed   By: Conchita Paris M.D.   On: 09/01/2015 10:48    Assessment/Plan:Missed abortion at 8 weeks, suction dilation and  currettage -We discussed risks, benefits and alternatives of suction dilation and currettage including risks of bleeding, infection, damage to organs, Asherman syndrome.  All questions answered.   [facility] 09/02/2015, 1:09 PM

## 2015-09-02 NOTE — Discharge Instructions (Signed)
DISCHARGE INSTRUCTIONS: D&C / D&E   Can start ibuprofen/motrin/advil  At 10PM     Increase water intake next 48 hours  Can use heating pad to abdomen The following instructions have been prepared to help you care for yourself upon your return home.   Personal hygiene:  Use sanitary pads for vaginal drainage, not tampons.  Shower the day after your procedure.  NO tub baths, pools or Jacuzzis for 2-3 weeks.  Wipe front to back after using the bathroom.  Activity and limitations:  Do NOT drive or operate any equipment for 24 hours. The effects of anesthesia are still present and drowsiness may result.  Do NOT rest in bed all day.  Walking is encouraged.  Walk up and down stairs slowly.  You may resume your normal activity in one to two days or as indicated by your physician.  Sexual activity: NO intercourse for at least 2 weeks after the procedure, or as indicated by your physician.  Diet: Eat a light meal as desired this evening. You may resume your usual diet tomorrow.  Return to work: You may resume your work activities in one to two days or as indicated by your doctor.  What to expect after your surgery: Expect to have vaginal bleeding/discharge for 2-3 days and spotting for up to 10 days. It is not unusual to have soreness for up to 1-2 weeks. You may have a slight burning sensation when you urinate for the first day. Mild cramps may continue for a couple of days. You may have a regular period in 2-6 weeks.  Call your doctor for any of the following:  Excessive vaginal bleeding, saturating and changing one pad every hour.  Inability to urinate 6 hours after discharge from hospital.  Pain not relieved by pain medication.  Fever of 100.4 F or greater.  Unusual vaginal discharge or odor.   Call for an appointment:    Patients signature: ______________________  Nurses signature ________________________  Support person's  signature_______________________

## 2015-09-05 ENCOUNTER — Encounter (HOSPITAL_COMMUNITY): Payer: Self-pay | Admitting: Obstetrics & Gynecology

## 2015-09-05 NOTE — Anesthesia Postprocedure Evaluation (Signed)
  Anesthesia Post-op Note  Patient: Becky Everett  Procedure(s) Performed: Procedure(s) (LRB): Suction DILATATION AND CURETTAGE (N/A)  Patient Location: PACU  Anesthesia Type: MAC  Level of Consciousness: awake and alert   Airway and Oxygen Therapy: Patient Spontanous Breathing  Post-op Pain: mild  Post-op Assessment: Post-op Vital signs reviewed, Patient's Cardiovascular Status Stable, Respiratory Function Stable, Patent Airway and No signs of Nausea or vomiting  Last Vitals:  Filed Vitals:   09/02/15 1736  BP: 136/77  Pulse: 124  Temp: 37.3 C  Resp: 16    Post-op Vital Signs: stable   Complications: No apparent anesthesia complications

## 2016-02-17 ENCOUNTER — Ambulatory Visit: Payer: 59 | Admitting: Women's Health

## 2016-02-29 ENCOUNTER — Ambulatory Visit: Payer: 59 | Admitting: Women's Health

## 2016-03-04 ENCOUNTER — Encounter (HOSPITAL_COMMUNITY)
Admission: EM | Disposition: A | Discharge status: Home / Self Care | Payer: Self-pay | Source: Home / Self Care | Attending: Emergency Medicine

## 2016-03-04 ENCOUNTER — Emergency Department (HOSPITAL_COMMUNITY): Payer: 59 | Admitting: Certified Registered Nurse Anesthetist

## 2016-03-04 ENCOUNTER — Encounter (HOSPITAL_COMMUNITY): Payer: Self-pay

## 2016-03-04 ENCOUNTER — Ambulatory Visit (HOSPITAL_COMMUNITY)
Admission: EM | Admit: 2016-03-04 | Discharge: 2016-03-05 | Disposition: A | Discharge status: Home / Self Care | Payer: 59 | Attending: Emergency Medicine | Admitting: Emergency Medicine

## 2016-03-04 DIAGNOSIS — R000 Tachycardia, unspecified: Secondary | ICD-10-CM | POA: Insufficient documentation

## 2016-03-04 DIAGNOSIS — Z3A10 10 weeks gestation of pregnancy: Secondary | ICD-10-CM | POA: Insufficient documentation

## 2016-03-04 DIAGNOSIS — Z791 Long term (current) use of non-steroidal anti-inflammatories (NSAID): Secondary | ICD-10-CM | POA: Diagnosis not present

## 2016-03-04 DIAGNOSIS — Z79891 Long term (current) use of opiate analgesic: Secondary | ICD-10-CM | POA: Diagnosis not present

## 2016-03-04 DIAGNOSIS — Z87891 Personal history of nicotine dependence: Secondary | ICD-10-CM | POA: Diagnosis not present

## 2016-03-04 DIAGNOSIS — I959 Hypotension, unspecified: Secondary | ICD-10-CM | POA: Insufficient documentation

## 2016-03-04 DIAGNOSIS — D62 Acute posthemorrhagic anemia: Secondary | ICD-10-CM | POA: Diagnosis present

## 2016-03-04 DIAGNOSIS — R61 Generalized hyperhidrosis: Secondary | ICD-10-CM | POA: Diagnosis not present

## 2016-03-04 DIAGNOSIS — O009 Unspecified ectopic pregnancy without intrauterine pregnancy: Secondary | ICD-10-CM | POA: Diagnosis present

## 2016-03-04 DIAGNOSIS — N939 Abnormal uterine and vaginal bleeding, unspecified: Secondary | ICD-10-CM | POA: Insufficient documentation

## 2016-03-04 DIAGNOSIS — K661 Hemoperitoneum: Secondary | ICD-10-CM | POA: Diagnosis not present

## 2016-03-04 DIAGNOSIS — O001 Tubal pregnancy without intrauterine pregnancy: Secondary | ICD-10-CM | POA: Insufficient documentation

## 2016-03-04 DIAGNOSIS — D282 Benign neoplasm of uterine tubes and ligaments: Secondary | ICD-10-CM | POA: Insufficient documentation

## 2016-03-04 DIAGNOSIS — R578 Other shock: Secondary | ICD-10-CM | POA: Diagnosis present

## 2016-03-04 DIAGNOSIS — E872 Acidosis: Secondary | ICD-10-CM | POA: Diagnosis not present

## 2016-03-04 DIAGNOSIS — Z79899 Other long term (current) drug therapy: Secondary | ICD-10-CM | POA: Insufficient documentation

## 2016-03-04 DIAGNOSIS — R109 Unspecified abdominal pain: Secondary | ICD-10-CM | POA: Diagnosis present

## 2016-03-04 HISTORY — PX: DIAGNOSTIC LAPAROSCOPY WITH REMOVAL OF ECTOPIC PREGNANCY: SHX6449

## 2016-03-04 LAB — CBC WITH DIFFERENTIAL/PLATELET
BASOS ABS: 0 10*3/uL (ref 0.0–0.1)
Basophils Absolute: 0 10*3/uL (ref 0.0–0.1)
Basophils Relative: 0 %
Basophils Relative: 0 %
EOS PCT: 0 %
Eosinophils Absolute: 0 10*3/uL (ref 0.0–0.7)
Eosinophils Absolute: 0 10*3/uL (ref 0.0–0.7)
Eosinophils Relative: 0 %
HCT: 29 % — ABNORMAL LOW (ref 36.0–46.0)
HEMATOCRIT: 30.1 % — AB (ref 36.0–46.0)
HEMOGLOBIN: 9.6 g/dL — AB (ref 12.0–15.0)
Hemoglobin: 10.2 g/dL — ABNORMAL LOW (ref 12.0–15.0)
LYMPHS ABS: 2.2 10*3/uL (ref 0.7–4.0)
LYMPHS ABS: 1.5 10*3/uL (ref 0.7–4.0)
LYMPHS PCT: 12 %
LYMPHS PCT: 10 %
MCH: 29.6 pg (ref 26.0–34.0)
MCH: 29.9 pg (ref 26.0–34.0)
MCHC: 33.1 g/dL (ref 30.0–36.0)
MCHC: 33.9 g/dL (ref 30.0–36.0)
MCV: 89.5 fL (ref 78.0–100.0)
MCV: 88.3 fL (ref 78.0–100.0)
Monocytes Absolute: 0.2 10*3/uL (ref 0.1–1.0)
Monocytes Absolute: 0.4 10*3/uL (ref 0.1–1.0)
Monocytes Relative: 1 %
Monocytes Relative: 2 %
NEUTROS ABS: 14.1 10*3/uL — AB (ref 1.7–7.7)
NEUTROS PCT: 87 %
Neutro Abs: 15.9 10*3/uL — ABNORMAL HIGH (ref 1.7–7.7)
Neutrophils Relative %: 88 %
Platelets: 274 10*3/uL (ref 150–400)
Platelets: 344 10*3/uL (ref 150–400)
RBC: 3.24 MIL/uL — AB (ref 3.87–5.11)
RBC: 3.41 MIL/uL — AB (ref 3.87–5.11)
RDW: 13.7 % (ref 11.5–15.5)
RDW: 13.8 % (ref 11.5–15.5)
WBC: 18.3 10*3/uL — AB (ref 4.0–10.5)
WBC: 16 10*3/uL — AB (ref 4.0–10.5)

## 2016-03-04 LAB — I-STAT CHEM 8, ED
BUN: 18 mg/dL (ref 6–20)
CHLORIDE: 102 mmol/L (ref 101–111)
Calcium, Ion: 1.05 mmol/L — ABNORMAL LOW (ref 1.12–1.23)
Creatinine, Ser: 0.8 mg/dL (ref 0.44–1.00)
GLUCOSE: 211 mg/dL — AB (ref 65–99)
HCT: 30 % — ABNORMAL LOW (ref 36.0–46.0)
Hemoglobin: 10.2 g/dL — ABNORMAL LOW (ref 12.0–15.0)
POTASSIUM: 5.3 mmol/L — AB (ref 3.5–5.1)
Sodium: 133 mmol/L — ABNORMAL LOW (ref 135–145)
TCO2: 19 mmol/L (ref 0–100)

## 2016-03-04 LAB — COMPREHENSIVE METABOLIC PANEL
ALBUMIN: 3.6 g/dL (ref 3.5–5.0)
ALK PHOS: 50 U/L (ref 38–126)
ALT: 20 U/L (ref 14–54)
AST: 30 U/L (ref 15–41)
Anion gap: 15 (ref 5–15)
BUN: 16 mg/dL (ref 6–20)
CALCIUM: 8.9 mg/dL (ref 8.9–10.3)
CO2: 18 mmol/L — AB (ref 22–32)
CREATININE: 0.97 mg/dL (ref 0.44–1.00)
Chloride: 104 mmol/L (ref 101–111)
GFR calc non Af Amer: >60 mL/min (ref >60)
GLUCOSE: 217 mg/dL — AB (ref 65–99)
Potassium: 5.4 mmol/L — ABNORMAL HIGH (ref 3.5–5.1)
SODIUM: 137 mmol/L (ref 135–145)
Total Bilirubin: 0.4 mg/dL (ref 0.3–1.2)
Total Protein: 6.6 g/dL (ref 6.5–8.1)

## 2016-03-04 LAB — I-STAT CG4 LACTIC ACID, ED: Lactic Acid, Venous: 7.84 mmol/L (ref 0.5–2.0)

## 2016-03-04 LAB — ABO/RH: ABO/RH(D): A POS

## 2016-03-04 LAB — I-STAT BETA HCG BLOOD, ED (MC, WL, AP ONLY): I-stat hCG, quantitative: >2000 m[IU]/mL — ABNORMAL HIGH (ref <5)

## 2016-03-04 LAB — CBC
HCT: 30.9 % — ABNORMAL LOW (ref 36.0–46.0)
Hemoglobin: 10.9 g/dL — ABNORMAL LOW (ref 12.0–15.0)
MCH: 30.6 pg (ref 26.0–34.0)
MCHC: 35.3 g/dL (ref 30.0–36.0)
MCV: 86.8 fL (ref 78.0–100.0)
PLATELETS: 251 10*3/uL (ref 150–400)
RBC: 3.56 MIL/uL — AB (ref 3.87–5.11)
RDW: 14.2 % (ref 11.5–15.5)
WBC: 13.1 10*3/uL — AB (ref 4.0–10.5)

## 2016-03-04 LAB — PREPARE RBC (CROSSMATCH)

## 2016-03-04 SURGERY — LAPAROSCOPY, WITH ECTOPIC PREGNANCY SURGICAL TREATMENT
Anesthesia: General | Site: Abdomen

## 2016-03-04 MED ORDER — PROMETHAZINE HCL 25 MG/ML IJ SOLN: 12.5000 mg | Freq: Four times a day (QID) | INTRAMUSCULAR | Status: DC | PRN

## 2016-03-04 MED ORDER — FENTANYL CITRATE (PF) 100 MCG/2ML IJ SOLN
INTRAMUSCULAR | Status: DC | PRN
  Administered 2016-03-04: 100 ug via INTRAVENOUS
  Administered 2016-03-04 (×2): 50 ug via INTRAVENOUS

## 2016-03-04 MED ORDER — MENTHOL 3 MG MT LOZG: 1.0000 | LOZENGE | OROMUCOSAL | Status: DC | PRN

## 2016-03-04 MED ORDER — OXYCODONE-ACETAMINOPHEN 5-325 MG PO TABS
1.0000 | ORAL_TABLET | ORAL | Status: DC | PRN
  Administered 2016-03-04: 2 via ORAL
  Filled 2016-03-04: qty 2

## 2016-03-04 MED ORDER — BUPIVACAINE HCL (PF) 0.25 % IJ SOLN
INTRAMUSCULAR | Status: DC | PRN
  Administered 2016-03-04: 7 mL

## 2016-03-04 MED ORDER — DEXTROSE IN LACTATED RINGERS 5 % IV SOLN
INTRAVENOUS | Status: DC
  Administered 2016-03-05: 05:00:00 via INTRAVENOUS

## 2016-03-04 MED ORDER — MIDAZOLAM HCL 2 MG/2ML IJ SOLN: 0.5000 mg | Freq: Once | INTRAMUSCULAR | Status: DC | PRN

## 2016-03-04 MED ORDER — LACTATED RINGERS IV SOLN
INTRAVENOUS | Status: DC | PRN
  Administered 2016-03-04 (×2): via INTRAVENOUS

## 2016-03-04 MED ORDER — MIDAZOLAM HCL 5 MG/5ML IJ SOLN
INTRAMUSCULAR | Status: DC | PRN
  Administered 2016-03-04: 2 mg via INTRAVENOUS

## 2016-03-04 MED ORDER — SODIUM CHLORIDE 0.9 % IV BOLUS (SEPSIS)
1000.0000 mL | Freq: Once | INTRAVENOUS | Status: AC
Start: 2016-03-04 — End: 2016-03-04
  Administered 2016-03-04: 1000 mL via INTRAVENOUS

## 2016-03-04 MED ORDER — PROMETHAZINE HCL 25 MG/ML IJ SOLN: 6.2500 mg | INTRAMUSCULAR | Status: DC | PRN

## 2016-03-04 MED ORDER — OXYCODONE-ACETAMINOPHEN 5-325 MG PO TABS: 1.0000 | ORAL_TABLET | Freq: Four times a day (QID) | ORAL | Status: DC | PRN

## 2016-03-04 MED ORDER — HYDROMORPHONE HCL 1 MG/ML IJ SOLN
INTRAMUSCULAR | Status: AC
  Filled 2016-03-04: qty 1

## 2016-03-04 MED ORDER — FENTANYL CITRATE (PF) 250 MCG/5ML IJ SOLN
INTRAMUSCULAR | Status: AC
  Filled 2016-03-04: qty 5

## 2016-03-04 MED ORDER — MEPERIDINE HCL 50 MG/ML IJ SOLN: 6.2500 mg | INTRAMUSCULAR | Status: DC | PRN

## 2016-03-04 MED ORDER — ROCURONIUM BROMIDE 100 MG/10ML IV SOLN
INTRAVENOUS | Status: DC | PRN
  Administered 2016-03-04: 50 mg via INTRAVENOUS
  Administered 2016-03-04: 10 mg via INTRAVENOUS

## 2016-03-04 MED ORDER — ONDANSETRON HCL 4 MG/2ML IJ SOLN
INTRAMUSCULAR | Status: DC | PRN
  Administered 2016-03-04: 4 mg via INTRAVENOUS

## 2016-03-04 MED ORDER — KETOROLAC TROMETHAMINE 30 MG/ML IJ SOLN: 30.0000 mg | Freq: Once | INTRAMUSCULAR | Status: DC

## 2016-03-04 MED ORDER — SUGAMMADEX SODIUM 200 MG/2ML IV SOLN
INTRAVENOUS | Status: DC | PRN
  Administered 2016-03-04: 200 mg via INTRAVENOUS

## 2016-03-04 MED ORDER — LIDOCAINE HCL (CARDIAC) 20 MG/ML IV SOLN
INTRAVENOUS | Status: AC
  Filled 2016-03-04: qty 5

## 2016-03-04 MED ORDER — PROPOFOL 10 MG/ML IV BOLUS
INTRAVENOUS | Status: AC
  Filled 2016-03-04: qty 20

## 2016-03-04 MED ORDER — 0.9 % SODIUM CHLORIDE (POUR BTL) OPTIME
TOPICAL | Status: DC | PRN
  Administered 2016-03-04: 1000 mL

## 2016-03-04 MED ORDER — KETOROLAC TROMETHAMINE 30 MG/ML IJ SOLN
30.0000 mg | Freq: Four times a day (QID) | INTRAMUSCULAR | Status: AC
  Administered 2016-03-04 – 2016-03-05 (×4): 30 mg via INTRAVENOUS
  Filled 2016-03-04 (×4): qty 1

## 2016-03-04 MED ORDER — HYDROMORPHONE HCL 1 MG/ML IJ SOLN
0.2500 mg | INTRAMUSCULAR | Status: DC | PRN
  Administered 2016-03-04: 0.5 mg via INTRAVENOUS
  Administered 2016-03-04 (×3): 0.25 mg via INTRAVENOUS
  Administered 2016-03-04: 0.5 mg via INTRAVENOUS
  Administered 2016-03-04: 0.25 mg via INTRAVENOUS

## 2016-03-04 MED ORDER — ONDANSETRON HCL 4 MG/2ML IJ SOLN
INTRAMUSCULAR | Status: AC
  Filled 2016-03-04: qty 2

## 2016-03-04 MED ORDER — LACTATED RINGERS IR SOLN
Status: DC | PRN
  Administered 2016-03-04: 1000 mL

## 2016-03-04 MED ORDER — ETOMIDATE 2 MG/ML IV SOLN
INTRAVENOUS | Status: DC | PRN
  Administered 2016-03-04: 20 mg via INTRAVENOUS

## 2016-03-04 MED ORDER — SUCCINYLCHOLINE CHLORIDE 20 MG/ML IJ SOLN
INTRAMUSCULAR | Status: DC | PRN
  Administered 2016-03-04: 100 mg via INTRAVENOUS

## 2016-03-04 MED ORDER — BUPIVACAINE HCL (PF) 0.25 % IJ SOLN
INTRAMUSCULAR | Status: AC
Start: 2016-03-04 — End: 2016-03-04
  Filled 2016-03-04: qty 30

## 2016-03-04 MED ORDER — SODIUM CHLORIDE 0.9 % IV SOLN
10.0000 mL/h | Freq: Once | INTRAVENOUS | Status: AC
  Administered 2016-03-04: 14:00:00 via INTRAVENOUS

## 2016-03-04 MED ORDER — IBUPROFEN 200 MG PO TABS: 600.0000 mg | ORAL_TABLET | Freq: Four times a day (QID) | ORAL | Status: DC | PRN

## 2016-03-04 MED ORDER — KETOROLAC TROMETHAMINE 30 MG/ML IJ SOLN
30.0000 mg | Freq: Four times a day (QID) | INTRAMUSCULAR | Status: AC
  Filled 2016-03-04: qty 1

## 2016-03-04 MED ORDER — MIDAZOLAM HCL 2 MG/2ML IJ SOLN
INTRAMUSCULAR | Status: AC
  Filled 2016-03-04: qty 2

## 2016-03-04 SURGICAL SUPPLY — 30 items
BAG SPEC RTRVL LRG 6X4 10 (ENDOMECHANICALS) ×1
CABLE HIGH FREQUENCY MONO STRZ (ELECTRODE) ×1 IMPLANT
DEVICE PMI PUNCTURE CLOSURE (MISCELLANEOUS) ×1 IMPLANT
DRAPE SHEET LG 3/4 BI-LAMINATE (DRAPES) ×1 IMPLANT
DURAPREP 26ML APPLICATOR (WOUND CARE) ×2 IMPLANT
FILTER SMOKE EVAC LAPAROSHD (FILTER) ×2 IMPLANT
GLOVE BIOGEL PI IND STRL 7.0 (GLOVE) IMPLANT
GLOVE BIOGEL PI INDICATOR 7.0 (GLOVE) ×5
GLOVE ECLIPSE 7.0 STRL STRAW (GLOVE) ×2 IMPLANT
GOWN STRL REUS W/TWL LRG LVL3 (GOWN DISPOSABLE) ×4 IMPLANT
KIT BASIN OR (CUSTOM PROCEDURE TRAY) ×2 IMPLANT
LIQUID BAND (GAUZE/BANDAGES/DRESSINGS) ×2 IMPLANT
NS IRRIG 1000ML POUR BTL (IV SOLUTION) ×4 IMPLANT
PACK GENERAL/GYN (CUSTOM PROCEDURE TRAY) ×1 IMPLANT
POUCH SPECIMEN RETRIEVAL 10MM (ENDOMECHANICALS) ×1 IMPLANT
SET IRRIG TUBING LAPAROSCOPIC (IRRIGATION / IRRIGATOR) ×1 IMPLANT
SHEARS HARMONIC ACE PLUS 36CM (ENDOMECHANICALS) ×1 IMPLANT
SHEET LAVH (DRAPES) ×1 IMPLANT
SLEEVE XCEL OPT CAN 5 100 (ENDOMECHANICALS) ×1 IMPLANT
SUT VIC AB 0 UR5 27 (SUTURE) ×1 IMPLANT
SUT VIC AB 3-0 X1 27 (SUTURE) ×1 IMPLANT
SUT VICRYL 0 UR6 27IN ABS (SUTURE) ×2 IMPLANT
TOWEL OR 17X26 10 PK STRL BLUE (TOWEL DISPOSABLE) ×1 IMPLANT
TOWEL OR NON WOVEN STRL DISP B (DISPOSABLE) ×1 IMPLANT
TRAY FOLEY CATH SILVER 14FR (SET/KITS/TRAYS/PACK) ×1 IMPLANT
TROCAR BALLN 12MMX100 BLUNT (TROCAR) ×1 IMPLANT
TROCAR OPTI TIP 5M 100M (ENDOMECHANICALS) ×1 IMPLANT
TROCAR XCEL DIL TIP R 11M (ENDOMECHANICALS) ×2 IMPLANT
TUBING INSUFFLATOR HI FLOW (MISCELLANEOUS) ×2 IMPLANT
WARMER LAPAROSCOPE (MISCELLANEOUS) ×1 IMPLANT

## 2016-03-04 NOTE — ED Notes (Signed)
Emergency release blood started  

## 2016-03-04 NOTE — ED Notes (Signed)
She c/o sudden onset of rieht-sided abd. Pain this morning at about 6 am.  She also tells [place] "I am [redacted] weeks pregnant today".  She is pale and diaphoretic and I take her directly to our resus room and initiate IV.

## 2016-03-04 NOTE — ED Notes (Signed)
Critical istat result given to EDP Knott and RN

## 2016-03-04 NOTE — Discharge Instructions (Signed)
Laparoscopic Ectopic Surgery, Care After Refer to this sheet in the next few weeks. These instructions provide you with information on caring for yourself after your procedure. Your caregiver may also give you more specific instructions. Your treatment has been planned according to current medical practices, but problems sometimes occur. Call your caregiver if you have any problems or questions after your procedure. HOME CARE INSTRUCTIONS  Take any medicine as directed by your caregiver. Follow the directions carefully.  Check your incisions every day.  Keep the incision area(s) dry. Ask your caregiver when it is safe to shower or bathe again.  Rest as much as possible for the next 3 days. Ask your caregiver when it is safe to go back to your normal activities.  Drink enough fluids to keep your urine clear or pale yellow.  Keep all follow-up appointments. Your caregiver will make sure you are healing the way you should be. SEEK MEDICAL CARE IF:   You have bleeding or discharge from your vagina.  You have pain in your abdomen.  You feel nauseous. SEEK IMMEDIATE MEDICAL CARE IF:   Your incision(s) becomes red, swollen, or tender.  Your incision(s) start(s) bleeding.  You have pus coming from any incision.  You have heavy or persistent vaginal bleeding or discharge.  You have severe or increased abdominal pain.  You cannot stop vomiting.  Your nausea will not go away.  You have a fever. MAKE SURE YOU:  Understand these instructions.  Watch your condition.  Get help right away if you are not doing well or get worse.   This information is not intended to replace advice given to you by your health care provider. Make sure you discuss any questions you have with your health care provider.   Document Released: 11/15/2011 Document Revised: 12/17/2014 Document Reviewed: 11/15/2011 Elsevier Interactive Patient Education 2016 Elsevier [employer]. Ruptured Ectopic Pregnancy An  ectopic pregnancy is when the fertilized egg attaches (implants) outside the uterus. Most ectopic pregnancies occur in the fallopian tube. Rarely do ectopic pregnancies occur on the ovary, intestine, pelvis, or cervix. An ectopic pregnancy does not have the ability to develop into a normal, healthy baby.  A ruptured ectopic pregnancy is one in which the fallopian tube gets torn or bursts and results in internal bleeding. Often there is intense abdominal pain, and sometimes, vaginal bleeding. Having an ectopic pregnancy can be a life-threatening experience. If left untreated, this dangerous condition can lead to a blood transfusion, abdominal surgery, or even death.  CAUSES  Damage to the fallopian tubes is the suspected cause in most ectopic pregnancies.  RISK FACTORS Depending on your circumstances, the amount of risk of having an ectopic pregnancy will vary. There are 3 categories that may help you identify whether you are potentially at risk. High Risk  You have gone through infertility treatment.  You have had a previous ectopic pregnancy.  You have had previous tubal surgery.  You have had previous surgery to have the fallopian tubes tied (tubal ligation).  You have tubal problems or diseases.  You have been exposed to DES. DES is a medicine that was used until 1971 and had effects on babies whose mothers took the medicine.  You become pregnant while using an intrauterine device (IUD) for birth control. Moderate Risk  You have a history of infertility.  You have a history of a sexually transmitted infection (STI).  You have a history of pelvic inflammatory disease (PID).  You have scarring from endometriosis.  You have  multiple sexual partners.  You smoke. Low Risk  You have had previous pelvic surgery.  You use vaginal douching.  You became sexually active before 37 years of age. SYMPTOMS An ectopic pregnancy should be suspected in anyone who has missed a period and  has abdominal pain or bleeding.  You may experience normal pregnancy symptoms, such as:  Nausea.  Tiredness.  Breast tenderness.  Symptoms that are not normal include:  Pain with intercourse.  Irregular vaginal bleeding or spotting.  Cramping or pain on one side, or in the lower abdomen.  Fast heartbeat.  Passing out while having a bowel movement.  Symptoms of a ruptured ectopic pregnancy and internal bleeding may include:  Sudden, severe pain in the abdomen and pelvis.  Dizziness or fainting.  Pain in the shoulder area. DIAGNOSIS  Tests that may be performed include:  A pregnancy test.  An ultrasound.  Testing the specific level of pregnancy hormone in the bloodstream.  Taking a sample of uterus tissue (dilation and curettage, D&C).  Surgery to perform a visual exam of the inside of the abdomen using a lighted tube (laparoscopy). TREATMENT  Laparoscopic surgery or abdominal surgery is recommended for a ruptured ectopic pregnancy.   The whole fallopian tube may need to be removed (salpingectomy).  If the tube is not too damaged, the tube may be saved, and the pregnancy will be surgically removed. Intime, the tube may still function.  If you have lost a lot of blood, you may need a blood transfusion.  You may receive a Rho (D) immune globulin shot if you are Rh negative and the father is Rh positive, or if you do not know the Rh type of the father. This is to prevent problems with any future pregnancy. SEEK IMMEDIATE MEDICAL CARE IF:  You have any symptoms of an ectopic or ruptured ectopic pregnancy. This is a medical emergency. MAKE SURE YOU:  Understand these instructions.  Will watch your condition.  Will get help right away if you are not doing well or get worse.   This information is not intended to replace advice given to you by your health care provider. Make sure you discuss any questions you have with your health care provider.   Document  Released: 11/23/2000 Document Revised: 12/01/2013 Document Reviewed: 09/07/2013 Elsevier Interactive Patient Education [employer].

## 2016-03-04 NOTE — ED Notes (Signed)
Surgery at bedside.

## 2016-03-04 NOTE — Anesthesia Preprocedure Evaluation (Addendum)
Anesthesia Evaluation  Patient identified by MRN, date of birth, ID band Patient awake    Reviewed: Allergy & Precautions, NPO status , Patient's Chart, lab work & pertinent test resultsPreop documentation limited or incomplete due to emergent nature of procedure.  History of Anesthesia Complications Negative for: history of anesthetic complications  Airway Mallampati: IV  TM Distance: >3 FB Neck ROM: Full    Dental  (+) Dental Advisory Given   Pulmonary former smoker,    breath sounds clear to auscultation       Cardiovascular  Rhythm:Regular Rate:Tachycardia  Tachycardic and hypotensive   Neuro/Psych  Headaches,    GI/Hepatic Neg liver ROS, N/V today   Endo/Other  Morbid obesity  Renal/GU negative Renal ROS     Musculoskeletal   Abdominal (+) + obese,   Peds  Hematology  (+) Blood dyscrasia (Hb 10.2), ,   Anesthesia Other Findings   Reproductive/Obstetrics (+) Pregnancy (ectopic)                           Anesthesia Physical Anesthesia Plan  ASA: II and emergent  Anesthesia Plan: General   Post-op Pain Management:    Induction: Intravenous and Rapid sequence  Airway Management Planned: Oral ETT  Additional Equipment:   Intra-op Plan:   Post-operative Plan: Possible Post-op intubation/ventilation  Informed Consent:   Only emergency history available  Plan Discussed with: CRNA and Surgeon  Anesthesia Plan Comments: (Plan routine monitors, GETA with RSI)        Anesthesia Quick Evaluation

## 2016-03-04 NOTE — ED Notes (Signed)
Dr. Laneta Simmers gave verbal order for 2 units emergency release blood.

## 2016-03-04 NOTE — Anesthesia Procedure Notes (Signed)
Procedure Name: Intubation Performed by: Gean Maidens Pre-anesthesia Checklist: Patient identified, Emergency Drugs available, Suction available, Patient being monitored and Timeout performed Patient Re-evaluated:Patient Re-evaluated prior to inductionOxygen Delivery Method: Circle system utilized Preoxygenation: Pre-oxygenation with 100% oxygen (RSI) Intubation Type: IV induction Laryngoscope Size: Glidescope and 3 Grade View: Grade I Tube type: Oral Tube size: 7.0 mm Number of attempts: 2 Airway Equipment and Method: Stylet and Video-laryngoscopy Placement Confirmation: ETT inserted through vocal cords under direct vision,  positive ETCO2,  CO2 detector and breath sounds checked- equal and bilateral Secured at: 21 cm Tube secured with: Tape Dental Injury: Bloody posterior oropharynx  Difficulty Due To: Difficulty was anticipated and Difficult Airway- due to limited oral opening Future Recommendations: Recommend- induction with short-acting agent, and alternative techniques readily available Comments: Able to achieve adequate view with glidescope easily, difficulty experienced when attempting to pass ETT into glidescope view, successful placement intially, however ETT accidentally pulled out of aiway when stylet was removed. Second attempt successful by MDA

## 2016-03-04 NOTE — H&P (Signed)
Becky Everett is an 35 y.o. G2P0010 female.   Chief Complaint: Abdominal pain HPI: 10 wks with acute onset abdominal pain this am. Reports pain is mostly RUQ. Has had her gall bladder removed.  Unstable on presentation and has O neg blood hanging by ED with hemoperitoneum c/w ruptured ectopic. Patient reports + home pregnancy test.  Has appointment with Dr. Toney Rakes' NP next week for verification. Has been treated for infertility and has h/o irregular cycles. No confirmation of pregnancy yet and no type and blood type.  Past Medical History  Diagnosis Date  . Gallstones   . Abdominal pain     right side per patient  . Migraines     tx with otc med    Past Surgical History  Procedure Laterality Date  . Cholecystectomy  03/05/2012    Procedure: LAPAROSCOPIC CHOLECYSTECTOMY WITH INTRAOPERATIVE CHOLANGIOGRAM;  Surgeon: Imogene Burn. Georgette Dover, MD;  Location: [facility];  Service: General;  Laterality: N/A;  . Pelvic laparoscopy      cystectomy  . Dilation and curettage of uterus N/A 09/02/2015    Procedure: Suction DILATATION AND CURETTAGE;  Surgeon: Waymon Amato, MD;  Location: [facility];  Service: Gynecology;  Laterality: N/A;    Family History  Problem Relation Age of Onset  . Stroke Mother   . Seizures Mother   . Hypertension Mother   . Diabetes Father   . Heart disease Father     has had two heart attacks   Social History:  reports that she quit smoking about 24 years ago. Her smoking use included Cigarettes. She has a .5 pack-year smoking history. She has never used smokeless tobacco. She reports that she drinks alcohol. She reports that she does not use illicit drugs.  Allergies: No Known Allergies  No current facility-administered medications on file prior to encounter.   Current Outpatient Prescriptions on File Prior to Encounter  Medication Sig Dispense Refill  . acetaminophen (TYLENOL) 325 MG tablet Take 650 mg by mouth every 6 (six) hours as needed for headache.    . ibuprofen  (ADVIL,MOTRIN) 800 MG tablet Take 1 tablet (800 mg total) by mouth every 8 (eight) hours as needed. (Patient not taking: Reported on 03/04/2016) 30 tablet 0  . oxyCODONE-acetaminophen (ROXICET) 5-325 MG per tablet Take 1 tablet by mouth every 4 (four) hours as needed for moderate pain or severe pain. (Patient not taking: Reported on 03/04/2016) 30 tablet 0    Pertinent items are noted in HPI.  Blood pressure 107/80, pulse 120, resp. rate 32, last menstrual period 12/29/2015, SpO2 100 %. General appearance: alert, cooperative, appears stated age and pale Neck: supple, symmetrical, trachea midline Lungs: normal effort Heart: regular rate and rhythm Abdomen: soft, + guarding, + rebound Extremities: extremities normal, atraumatic, no cyanosis or edema Skin: pale, sweaty Neurologic: Grossly normal   Lab Results  Component Value Date   WBC 8.2 09/01/2015   HGB 12.1 09/01/2015   HCT 35.9* 09/01/2015   MCV 88.4 09/01/2015   PLT 311 09/01/2015   Lab Results  Component Value Date   PREGTESTUR POS 07/22/2015   PREGSERUM NEGATIVE 02/26/2012   Bedside u/s shows no IUP.  Assessment/Plan Principal Problem:   Ruptured ectopic pregnancy   Risks include but are not limited to bleeding, infection, injury to surrounding structures, including bowel, bladder and ureters, blood clots, and death.  Likelihood of success is high.    Abdulmalik Darco S 03/04/2016, 1:25 PM

## 2016-03-04 NOTE — ED Provider Notes (Signed)
CSN: [id number]     Arrival date & time 03/04/16  1221 History   First MD Initiated Contact with Patient 03/04/16 1245     Chief Complaint  Patient presents with  . Abdominal Pain     (Consider location/radiation/quality/duration/timing/severity/associated sxs/prior Treatment) Patient is a 35 y.o. female presenting with abdominal pain. The history is provided by the patient.  Abdominal Pain Pain location:  RUQ (right flank) Pain quality: aching and dull   Pain radiates to:  Does not radiate Pain severity:  Moderate Onset quality:  Gradual Duration:  6 hours Timing:  Constant Progression:  Worsening Chronicity:  New Context: previous surgery (s/p chole)   Relieved by:  Nothing Worsened by:  Nothing tried Ineffective treatments:  None tried Associated symptoms: vaginal bleeding   Associated symptoms: no fever   Risk factors: obesity and pregnancy     Past Medical History  Diagnosis Date  . Gallstones   . Abdominal pain     right side per patient  . Migraines     tx with otc med   Past Surgical History  Procedure Laterality Date  . Cholecystectomy  03/05/2012    Procedure: LAPAROSCOPIC CHOLECYSTECTOMY WITH INTRAOPERATIVE CHOLANGIOGRAM;  Surgeon: Imogene Burn. Georgette Dover, MD;  Location: [facility];  Service: General;  Laterality: N/A;  . Pelvic laparoscopy      cystectomy  . Dilation and curettage of uterus N/A 09/02/2015    Procedure: Suction DILATATION AND CURETTAGE;  Surgeon: Waymon Amato, MD;  Location: [facility];  Service: Gynecology;  Laterality: N/A;   Family History  Problem Relation Age of Onset  . Stroke Mother   . Seizures Mother   . Hypertension Mother   . Diabetes Father   . Heart disease Father     has had two heart attacks   Social History  Substance Use Topics  . Smoking status: Former Smoker -- 0.25 packs/day for 2 years    Types: Cigarettes    Quit date: 01/08/1992  . Smokeless tobacco: Never Used  . Alcohol Use: Yes     Comment: socially - rare   OB History     Gravida Para Term Preterm AB TAB SAB Ectopic Multiple Living   1              Review of Systems  Constitutional: Negative for fever.  Gastrointestinal: Positive for abdominal pain.  Genitourinary: Positive for vaginal bleeding.  All other systems reviewed and are negative.     Allergies  Review of patient's allergies indicates no known allergies.  Home Medications   Prior to Admission medications   Medication Sig Start Date End Date Taking? Authorizing Provider  acetaminophen (TYLENOL) 325 MG tablet Take 650 mg by mouth every 6 (six) hours as needed for headache.    Historical Provider, MD  ibuprofen (ADVIL,MOTRIN) 800 MG tablet Take 1 tablet (800 mg total) by mouth every 8 (eight) hours as needed. 09/02/15   Waymon Amato, MD  oxyCODONE-acetaminophen (ROXICET) 5-325 MG per tablet Take 1 tablet by mouth every 4 (four) hours as needed for moderate pain or severe pain. 09/02/15   Waymon Amato, MD   LMP 12/29/2015 (Approximate) Physical Exam  Constitutional: She is oriented to person, place, and time. She appears well-developed and well-nourished. No distress.  HENT:  Head: Normocephalic.  Eyes: Conjunctivae are normal.  Neck: Neck supple. No tracheal deviation present.  Cardiovascular: Regular rhythm and normal heart sounds.  Tachycardia present.   Pulmonary/Chest: Effort normal and breath sounds normal. No respiratory distress.  Abdominal: She exhibits distension. There is tenderness in the right upper quadrant.  Neurological: She is alert and oriented to person, place, and time.  Skin: Skin is warm and dry.  Psychiatric: She has a normal mood and affect.    ED Course  Procedures (including critical care time)  CRITICAL CARE Performed by: Leo Grosser Total critical care time: 30 minutes Critical care time was exclusive of separately billable procedures and treating other patients. Critical care was necessary to treat or prevent imminent or life-threatening  deterioration. Critical care was time spent personally by me on the following activities: development of treatment plan with patient and/or surrogate as well as nursing, discussions with consultants, evaluation of patient's response to treatment, examination of patient, obtaining history from patient or surrogate, ordering and performing treatments and interventions, ordering and review of laboratory studies, ordering and review of radiographic studies, pulse oximetry and re-evaluation of patient's condition.  Emergency Focused Ultrasound Exam Limited Ultrasound of the Abdomen and Pericardium (FAST Exam)  Performed and interpreted by Dr. Laneta Simmers Indication: hypotension, abdominal pain Multiple views of the abdomen and pericardium are obtained with a multi-frequency probe. Findings: + anechoic fluid in abdomen, no anechoic fluid surrounding heart Interpretation: + hemoperitoneum, no pericardial effusion, without tamponade Images archived electronically.  CPT Codes: cardiac [phone], abdomen [phone] (study includes both codes)   Labs Review Labs Reviewed  CBC WITH DIFFERENTIAL/PLATELET - Abnormal; Notable for the following:    WBC 18.3 (*)    RBC 3.24 (*)    Hemoglobin 9.6 (*)    HCT 29.0 (*)    Neutro Abs 15.9 (*)    All other components within normal limits  COMPREHENSIVE METABOLIC PANEL - Abnormal; Notable for the following:    Potassium 5.4 (*)    CO2 18 (*)    Glucose, Bld 217 (*)    All other components within normal limits  CBC WITH DIFFERENTIAL/PLATELET - Abnormal; Notable for the following:    WBC 16.0 (*)    RBC 3.41 (*)    Hemoglobin 10.2 (*)    HCT 30.1 (*)    Neutro Abs 14.1 (*)    All other components within normal limits  I-STAT CG4 LACTIC ACID, ED - Abnormal; Notable for the following:    Lactic Acid, Venous 7.84 (*)    All other components within normal limits  I-STAT BETA HCG BLOOD, ED ([facility], [facility], [facility] ONLY) - Abnormal; Notable for the following:    I-stat hCG,  quantitative >2000.0 (*)    All other components within normal limits  I-STAT CHEM 8, ED - Abnormal; Notable for the following:    Sodium 133 (*)    Potassium 5.3 (*)    Glucose, Bld 211 (*)    Calcium, Ion 1.05 (*)    Hemoglobin 10.2 (*)    HCT 30.0 (*)    All other components within normal limits  URINALYSIS, ROUTINE W REFLEX MICROSCOPIC (NOT AT [facility])  PROTIME-INR  APTT  PREPARE RBC (CROSSMATCH)  TYPE AND SCREEN  ABO/RH    Imaging Review No results found. I have personally reviewed and evaluated these images and lab results as part of my medical decision-making.   EKG Interpretation None      MDM   Final diagnoses:  Ruptured ectopic pregnancy  Hemoperitoneum  Hemorrhagic shock   Patient presented from triage pale, tachycardic, diaphoretic with diffuse abdominal tenderness.   12:42 PM d/w faculty practice OB regarding hypotension, abdominal pain, scant new onset vaginal bleeding, history of 10 week pregnancy,  findings of hemoperitoneum on bedside US without IUP concerning for ruptured ectopic. I do not feel patient is stable for [facility] and OB came to see Pt in ED at [facility].   Labs and emergent release blood ordered with fluids infusing, hypotension improved, remains tachycardic from 120s-130s with pallor.   OB at bedside, Pt to OR with blood infusing, initial Hb is 10 and no further episodes of hypotension noted in ED since beginning resuscitation.    Leo Grosser, MD 03/04/16 [phone]

## 2016-03-04 NOTE — Op Note (Signed)
Nelda Severe  PROCEDURE DATE: 03/04/2016  PREOPERATIVE DIAGNOSIS: Ruptured ectopic pregnancy  POSTOPERATIVE DIAGNOSIS: Ruptured left fallopian tube ectopic pregnancy  PROCEDURE: Laparoscopic left salpingectomy   SURGEON:  Dr. Darron Doom  ASSISTANT: Rolly Pancake MD  ANESTHESIOLOGIST: Annye Asa, MD  INDICATIONS: 35 y.o. G2P0010 at 10 wks here for with ruptured ectopic pregnancy with blood type A pos. Patient was counseled regarding need for laparoscopic salpingectomy. Risks of surgery including bleeding which may require transfusion or reoperation, infection, injury to bowel or other surrounding organs, need for additional procedures including laparotomy and other postoperative/anesthesia complications were explained to patient.  Written informed consent was obtained.  FINDINGS:  Large amount of hemoperitoneum estimated to be about 1L of blood and clots.  Dilated left fallopian tube containing ectopic gestation. Small normal appearing uterus, normal left fallopian tube, left ovary and right ovary.  ANESTHESIA: General  SPECIMENS: left fallopian tube to pathology  COMPLICATIONS: None immediate  PROCEDURE IN DETAIL:  The patient was taken to the operating room where general anesthesia was administered and was found to be adequate.  She was placed in the dorsal lithotomy position, and was prepped and draped in a sterile manner.  A Foley catheter was inserted into her bladder and attached to constant drainage and a uterine manipulator was then advanced into the uterus .  After an adequate timeout was performed, attention was then turned to the patient's abdomen where a 10-mm skin incision was made on the umbilical fold. Fascia and peritoneum were entered sharply.  One 0 Vicryl sutures was used to tag the fascia circumferentially.  A Hassan trocar was placed. The laparoscope was introduced.  A survey of the patient's pelvis and abdomen revealed the findings as above.  Bilateral lower  quadrant ports were placed under direct visualization; 5-mm on the left and 10-mm on the right.  Suction/Irrigation was used to clear the large hemoperitoneum. Attention was then turned to the left fallopian tube which was grasped and ligated from the underlying mesosalpinx and uterine attachment using the Harmonic instrument.  Good hemostasis was noted.  The specimen was placed in an EndoCatch bag and removed from the abdomen intact.  Further suction was used to remove the blood in the peritoneal cavity. The 10 mm port was closed with a fascial closure device.The abdomen was desufflated, and all instruments were removed.  The umbilicus incision was closed with the afore mentioned Vicryl suture; and all skin incisions were closed with a 3-0 Vicryl subcuticular stitch and Liqui-Band. The patient tolerated the procedures well.  All instruments, needles, and sponge counts were correct x 2. The patient was taken to the recovery room in stable condition.   PRATT,TANYA S MD 03/04/2016 3:48 PM

## 2016-03-04 NOTE — Transfer of Care (Signed)
Immediate Anesthesia Transfer of Care Note  Patient: Becky Everett  Procedure(s) Performed: Procedure(s): DIAGNOSTIC LAPAROSCOPY WITH REMOVAL OF ECTOPIC PREGNANCY (N/A)  Patient Location: PACU  Anesthesia Type:General  Level of Consciousness: sedated, patient cooperative and responds to stimulation  Airway & Oxygen Therapy: Patient Spontanous Breathing and Patient connected to face mask oxygen  Post-op Assessment: Report given to RN and Post -op Vital signs reviewed and stable  Post vital signs: Reviewed and stable  Last Vitals:  Filed Vitals:   03/04/16 1328 03/04/16 1330  BP:  113/70  Pulse:  116  Temp: 36.7 C   Resp:  35    Complications: No apparent anesthesia complications

## 2016-03-04 NOTE — Anesthesia Postprocedure Evaluation (Signed)
Anesthesia Post Note  Patient: Becky Everett  Procedure(s) Performed: Procedure(s) (LRB): DIAGNOSTIC LAPAROSCOPY WITH REMOVAL OF ECTOPIC PREGNANCY (N/A)  Patient location during evaluation: PACU Anesthesia Type: General Level of consciousness: sedated, patient cooperative and oriented Pain management: pain level controlled Vital Signs Assessment: post-procedure vital signs reviewed and stable Respiratory status: spontaneous breathing, nonlabored ventilation, respiratory function stable and patient connected to nasal cannula oxygen Cardiovascular status: blood pressure returned to baseline and stable Postop Assessment: no signs of nausea or vomiting Anesthetic complications: no    Last Vitals:  Filed Vitals:   03/04/16 1330 03/04/16 1608  BP: 113/70 136/73  Pulse: 116   Temp:  36.4 C  Resp: 35     Last Pain:  Filed Vitals:   03/04/16 1619  PainSc: 10-Worst pain ever                 Marynell Bies,E. Calvyn Kurtzman

## 2016-03-05 ENCOUNTER — Encounter (HOSPITAL_COMMUNITY): Payer: Self-pay | Admitting: Family Medicine

## 2016-03-05 LAB — COMPREHENSIVE METABOLIC PANEL
ALK PHOS: 37 U/L — AB (ref 38–126)
ALT: 27 U/L (ref 14–54)
AST: 74 U/L — AB (ref 15–41)
Albumin: 2.9 g/dL — ABNORMAL LOW (ref 3.5–5.0)
Anion gap: 9 (ref 5–15)
BUN: 13 mg/dL (ref 6–20)
CALCIUM: 7.9 mg/dL — AB (ref 8.9–10.3)
CHLORIDE: 103 mmol/L (ref 101–111)
CO2: 23 mmol/L (ref 22–32)
CREATININE: 0.61 mg/dL (ref 0.44–1.00)
GFR calc Af Amer: >60 mL/min (ref >60)
GFR calc non Af Amer: >60 mL/min (ref >60)
GLUCOSE: 131 mg/dL — AB (ref 65–99)
Potassium: 4.1 mmol/L (ref 3.5–5.1)
Sodium: 135 mmol/L (ref 135–145)
Total Bilirubin: 0.6 mg/dL (ref 0.3–1.2)
Total Protein: 5.3 g/dL — ABNORMAL LOW (ref 6.5–8.1)

## 2016-03-05 LAB — CBC
HCT: 27.2 % — ABNORMAL LOW (ref 36.0–46.0)
HEMOGLOBIN: 9.4 g/dL — AB (ref 12.0–15.0)
MCH: 29.9 pg (ref 26.0–34.0)
MCHC: 34.6 g/dL (ref 30.0–36.0)
MCV: 86.6 fL (ref 78.0–100.0)
PLATELETS: 225 10*3/uL (ref 150–400)
RBC: 3.14 MIL/uL — AB (ref 3.87–5.11)
RDW: 14.4 % (ref 11.5–15.5)
WBC: 10.1 10*3/uL (ref 4.0–10.5)

## 2016-03-05 NOTE — Discharge Summary (Signed)
Physician Discharge Summary  [name] MRN: [id number] DOB/AGE: 02/19/1981 35 y.o.  Admit date: 03/04/2016 Discharge date:   Admission Diagnoses:  Principal Problem:   Ruptured ectopic pregnancy Active Problems:   Hemorrhagic shock   Hemoperitoneum   Acute blood loss anemia   Lactic acidemia   Discharge Diagnoses:  Same  Past Medical History  Diagnosis Date  . Gallstones   . Abdominal pain     right side per patient    Surgeries: Procedure(s): DIAGNOSTIC LAPAROSCOPY WITH REMOVAL OF ECTOPIC PREGNANCY on 03/04/2016   Consultants: Treatment Team:  Donnamae Jude, MD  Discharged Condition: Improved  Hospital Course: Becky Everett is an 35 y.o. female G1P0 who was admitted 03/04/2016 with a chief complaint of  Chief Complaint  Patient presents with  . Abdominal Pain  , and found to have a diagnosis of Ruptured ectopic pregnancy.  They were brought to the operating room on 03/04/2016 and underwent the above named procedures.    They were given sequential compression devices, early ambulation, and chemoprophylaxis for DVT prophylaxis. She received 3 u PRBC's, and was noted to be in hemorrhagic shock and lactic acidosis which resolved with fluid resuscitation in the OR.  Stayed overnight with stable Hgb and normal CMP in the am. Voiding and ambulating without difficulty. Tolerating po.  They benefited maximally from their hospital stay and there were no complications.    Recent vital signs:  Filed Vitals:   03/05/16 0200 03/05/16 0620  BP: 115/55 106/49  Pulse: 111 118  Temp: 98 F (36.7 C) 98.2 F (36.8 C)  Resp: 20 18   Discharge exam: Physical Examination: General appearance - alert, well appearing, and in no distress Chest - normal effort Abdomen - soft, appropriately tender, incisions are dry and intact Neurological - alert, oriented, normal speech, no focal findings or movement disorder noted Extremities - peripheral pulses normal, no pedal  edema, no clubbing or cyanosis  Recent laboratory studies:  Results for orders placed or performed during the hospital encounter of 03/04/16  CBC with Differential/Platelet  Result Value Ref Range   WBC 18.3 (H) 4.0 - 10.5 K/uL   RBC 3.24 (L) 3.87 - 5.11 MIL/uL   Hemoglobin 9.6 (L) 12.0 - 15.0 g/dL   HCT 29.0 (L) 36.0 - 46.0 %   MCV 89.5 78.0 - 100.0 fL   MCH 29.6 26.0 - 34.0 pg   MCHC 33.1 30.0 - 36.0 g/dL   RDW 13.7 11.5 - 15.5 %   Platelets 274 150 - 400 K/uL   Neutrophils Relative % 87 %   Neutro Abs 15.9 (H) 1.7 - 7.7 K/uL   Lymphocytes Relative 12 %   Lymphs Abs 2.2 0.7 - 4.0 K/uL   Monocytes Relative 1 %   Monocytes Absolute 0.2 0.1 - 1.0 K/uL   Eosinophils Relative 0 %   Eosinophils Absolute 0.0 0.0 - 0.7 K/uL   Basophils Relative 0 %   Basophils Absolute 0.0 0.0 - 0.1 K/uL  Comprehensive metabolic panel  Result Value Ref Range   Sodium 137 135 - 145 mmol/L   Potassium 5.4 (H) 3.5 - 5.1 mmol/L   Chloride 104 101 - 111 mmol/L   CO2 18 (L) 22 - 32 mmol/L   Glucose, Bld 217 (H) 65 - 99 mg/dL   BUN 16 6 - 20 mg/dL   Creatinine, Ser 0.97 0.44 - 1.00 mg/dL   Calcium 8.9 8.9 - 10.3 mg/dL   Total Protein 6.6 6.5 - 8.1 g/dL  Albumin 3.6 3.5 - 5.0 g/dL   AST 30 15 - 41 U/L   ALT 20 14 - 54 U/L   Alkaline Phosphatase 50 38 - 126 U/L   Total Bilirubin 0.4 0.3 - 1.2 mg/dL   GFR calc non Af Amer >60 >60 mL/min   GFR calc Af Amer >60 >60 mL/min   Anion gap 15 5 - 15  CBC with Differential/Platelet  Result Value Ref Range   WBC 16.0 (H) 4.0 - 10.5 K/uL   RBC 3.41 (L) 3.87 - 5.11 MIL/uL   Hemoglobin 10.2 (L) 12.0 - 15.0 g/dL   HCT 30.1 (L) 36.0 - 46.0 %   MCV 88.3 78.0 - 100.0 fL   MCH 29.9 26.0 - 34.0 pg   MCHC 33.9 30.0 - 36.0 g/dL   RDW 13.8 11.5 - 15.5 %   Platelets 344 150 - 400 K/uL   Neutrophils Relative % 88 %   Neutro Abs 14.1 (H) 1.7 - 7.7 K/uL   Lymphocytes Relative 10 %   Lymphs Abs 1.5 0.7 - 4.0 K/uL   Monocytes Relative 2 %   Monocytes Absolute 0.4  0.1 - 1.0 K/uL   Eosinophils Relative 0 %   Eosinophils Absolute 0.0 0.0 - 0.7 K/uL   Basophils Relative 0 %   Basophils Absolute 0.0 0.0 - 0.1 K/uL  CBC  Result Value Ref Range   WBC 10.1 4.0 - 10.5 K/uL   RBC 3.14 (L) 3.87 - 5.11 MIL/uL   Hemoglobin 9.4 (L) 12.0 - 15.0 g/dL   HCT 27.2 (L) 36.0 - 46.0 %   MCV 86.6 78.0 - 100.0 fL   MCH 29.9 26.0 - 34.0 pg   MCHC 34.6 30.0 - 36.0 g/dL   RDW 14.4 11.5 - 15.5 %   Platelets 225 150 - 400 K/uL  Comprehensive metabolic panel  Result Value Ref Range   Sodium 135 135 - 145 mmol/L   Potassium 4.1 3.5 - 5.1 mmol/L   Chloride 103 101 - 111 mmol/L   CO2 23 22 - 32 mmol/L   Glucose, Bld 131 (H) 65 - 99 mg/dL   BUN 13 6 - 20 mg/dL   Creatinine, Ser 0.61 0.44 - 1.00 mg/dL   Calcium 7.9 (L) 8.9 - 10.3 mg/dL   Total Protein 5.3 (L) 6.5 - 8.1 g/dL   Albumin 2.9 (L) 3.5 - 5.0 g/dL   AST 74 (H) 15 - 41 U/L   ALT 27 14 - 54 U/L   Alkaline Phosphatase 37 (L) 38 - 126 U/L   Total Bilirubin 0.6 0.3 - 1.2 mg/dL   GFR calc non Af Amer >60 >60 mL/min   GFR calc Af Amer >60 >60 mL/min   Anion gap 9 5 - 15  CBC  Result Value Ref Range   WBC 13.1 (H) 4.0 - 10.5 K/uL   RBC 3.56 (L) 3.87 - 5.11 MIL/uL   Hemoglobin 10.9 (L) 12.0 - 15.0 g/dL   HCT 30.9 (L) 36.0 - 46.0 %   MCV 86.8 78.0 - 100.0 fL   MCH 30.6 26.0 - 34.0 pg   MCHC 35.3 30.0 - 36.0 g/dL   RDW 14.2 11.5 - 15.5 %   Platelets 251 150 - 400 K/uL  I-Stat CG4 Lactic Acid, ED  Result Value Ref Range   Lactic Acid, Venous 7.84 (HH) 0.5 - 2.0 mmol/L   Comment NOTIFIED PHYSICIAN   I-Stat Beta hCG blood, ED ([facility], [facility], [facility] only)  Result Value Ref Range   I-stat  hCG, quantitative >2000.0 (H) <5 mIU/mL   Comment 3          I-Stat Chem 8, ED  Result Value Ref Range   Sodium 133 (L) 135 - 145 mmol/L   Potassium 5.3 (H) 3.5 - 5.1 mmol/L   Chloride 102 101 - 111 mmol/L   BUN 18 6 - 20 mg/dL   Creatinine, Ser 0.80 0.44 - 1.00 mg/dL   Glucose, Bld 211 (H) 65 - 99 mg/dL   Calcium, Ion 1.05 (L)  1.12 - 1.23 mmol/L   TCO2 19 0 - 100 mmol/L   Hemoglobin 10.2 (L) 12.0 - 15.0 g/dL   HCT 30.0 (L) 36.0 - 46.0 %  Prepare RBC  Result Value Ref Range   Order Confirmation ORDER PROCESSED BY BLOOD BANK   Type and screen [place]  Result Value Ref Range   ABO/RH(D) A POS    Antibody Screen NEG    Sample Expiration 03/07/2016    Unit Number [id number]    Blood Component Type RED CELLS,LR    Unit division 00    Status of Unit ISSUED,FINAL    Unit tag comment VERBAL ORDERS PER DR KNOTT    Transfusion Status OK TO TRANSFUSE    Crossmatch Result COMPATIBLE    Unit Number [id number]    Blood Component Type RBC LR PHER1    Unit division 00    Status of Unit ISSUED,FINAL    Unit tag comment VERBAL ORDERS PER DR KNOTT    Transfusion Status OK TO TRANSFUSE    Crossmatch Result COMPATIBLE    Unit Number [id number]    Blood Component Type RED CELLS,LR    Unit division 00    Status of Unit ALLOCATED    Transfusion Status OK TO TRANSFUSE    Crossmatch Result Compatible    Unit Number [id number]    Blood Component Type RED CELLS,LR    Unit division 00    Status of Unit ISSUED,FINAL    Transfusion Status OK TO TRANSFUSE    Crossmatch Result Compatible   ABO/Rh  Result Value Ref Range   ABO/RH(D) A POS     Discharge Medications:     Medication List    STOP taking these medications        ibuprofen 800 MG tablet  Commonly known as:  ADVIL,MOTRIN      TAKE these medications        acetaminophen 325 MG tablet  Commonly known as:  TYLENOL  Take 650 mg by mouth every 6 (six) hours as needed for headache.     oxyCODONE-acetaminophen 5-325 MG tablet  Commonly known as:  ROXICET  Take 1-2 tablets by mouth every 6 (six) hours as needed for severe pain.     prenatal multivitamin Tabs tablet  Take 1 tablet by mouth daily at 12 noon.        Diagnostic Studies: No results found.  Disposition: 01-Home or Self Care      Discharge Instructions      Remove dressing in 72 hours    Complete by:  As directed      Call MD for:  persistant nausea and vomiting    Complete by:  As directed      Call MD for:  redness, tenderness, or signs of infection (pain, swelling, redness, odor or green/yellow discharge around incision site)    Complete by:  As directed      Call MD for:  severe uncontrolled pain  Complete by:  As directed      Call MD for:  temperature >100.4    Complete by:  As directed      Diet - low sodium heart healthy    Complete by:  As directed      Discharge patient    Complete by:  As directed      Increase activity slowly    Complete by:  As directed      Lifting restrictions    Complete by:  As directed   Nothing > 20 lbs x 2 wks     Sexual Activity Restrictions    Complete by:  As directed   None x 2 wks           Follow-up Information    Follow up with [facility] In 2 weeks.   Specialty:  Obstetrics and Gynecology   Why:  postop check, they will call you with an appointment or you may follow up with your gynecologist   Contact information:   [place] [phone]       Signed: Donnamae Jude 03/05/2016, 9:35 AM

## 2016-03-05 NOTE — Progress Notes (Addendum)
On call physician called at [phone] in regards to patient heart rate 130's at rest. Pt BP WNL. Will collect lab work to monitor Hg. Will continue to monitor.

## 2016-03-06 LAB — POCT I-STAT 7, (LYTES, BLD GAS, ICA,H+H)
ACID-BASE DEFICIT: 10 mmol/L — AB (ref 0.0–2.0)
Acid-base deficit: 7 mmol/L — ABNORMAL HIGH (ref 0.0–2.0)
BICARBONATE: 20.9 meq/L (ref 20.0–24.0)
Bicarbonate: 16 mEq/L — ABNORMAL LOW (ref 20.0–24.0)
CALCIUM ION: 1.09 mmol/L — AB (ref 1.12–1.23)
CALCIUM ION: 1.09 mmol/L — AB (ref 1.12–1.23)
HCT: 28 % — ABNORMAL LOW (ref 36.0–46.0)
HEMATOCRIT: 34 % — AB (ref 36.0–46.0)
HEMOGLOBIN: 11.6 g/dL — AB (ref 12.0–15.0)
Hemoglobin: 9.5 g/dL — ABNORMAL LOW (ref 12.0–15.0)
O2 Saturation: 100 %
O2 Saturation: 97 %
PCO2 ART: 53.6 mmHg — AB (ref 35.0–45.0)
PH ART: 7.273 — AB (ref 7.350–7.450)
PO2 ART: 317 mmHg — AB (ref 80.0–100.0)
Potassium: 5.7 mmol/L — ABNORMAL HIGH (ref 3.5–5.1)
Potassium: 5.2 mmol/L — ABNORMAL HIGH (ref 3.5–5.1)
SODIUM: 136 mmol/L (ref 135–145)
Sodium: 134 mmol/L — ABNORMAL LOW (ref 135–145)
TCO2: 22 mmol/L (ref 0–100)
TCO2: 17 mmol/L (ref 0–100)
pCO2 arterial: 34.7 mmHg — ABNORMAL LOW (ref 35.0–45.0)
pH, Arterial: 7.199 — CL (ref 7.350–7.450)
pO2, Arterial: 100 mmHg (ref 80.0–100.0)

## 2016-03-08 LAB — TYPE AND SCREEN
ABO/RH(D): A POS
ANTIBODY SCREEN: NEGATIVE
UNIT DIVISION: 0
UNIT DIVISION: 0
Unit division: 0
Unit division: 0

## 2016-03-13 ENCOUNTER — Encounter: Payer: Self-pay | Admitting: Women's Health

## 2016-03-13 ENCOUNTER — Ambulatory Visit: Payer: 59 | Admitting: Women's Health

## 2016-03-13 ENCOUNTER — Ambulatory Visit (INDEPENDENT_AMBULATORY_CARE_PROVIDER_SITE_OTHER): Payer: 59 | Admitting: Women's Health

## 2016-03-13 VITALS — BP 124/80 | Ht 62.0 in | Wt 194.0 lb

## 2016-03-13 DIAGNOSIS — D6489 Other specified anemias: Secondary | ICD-10-CM

## 2016-03-13 LAB — CBC WITH DIFFERENTIAL/PLATELET
BASOS PCT: 0 %
Basophils Absolute: 0 cells/uL (ref 0–200)
EOS ABS: 244 {cells}/uL (ref 15–500)
Eosinophils Relative: 2 %
HCT: 35.2 % (ref 35.0–45.0)
Hemoglobin: 11.9 g/dL (ref 11.7–15.5)
Lymphocytes Relative: 30 %
Lymphs Abs: 3660 cells/uL (ref 850–3900)
MCH: 29.9 pg (ref 27.0–33.0)
MCHC: 33.8 g/dL (ref 32.0–36.0)
MCV: 88.4 fL (ref 80.0–100.0)
MONO ABS: 488 {cells}/uL (ref 200–950)
MONOS PCT: 4 %
MPV: 9.8 fL (ref 7.5–12.5)
NEUTROS ABS: 7808 {cells}/uL — AB (ref 1500–7800)
Neutrophils Relative %: 64 %
PLATELETS: 521 10*3/uL — AB (ref 140–400)
RBC: 3.98 MIL/uL (ref 3.80–5.10)
RDW: 14.1 % (ref 11.0–15.0)
WBC: 12.2 10*3/uL — ABNORMAL HIGH (ref 3.8–10.8)

## 2016-03-13 NOTE — Patient Instructions (Addendum)
Ruptured Ectopic Pregnancy An ectopic pregnancy is when the fertilized egg attaches (implants) outside the uterus. Most ectopic pregnancies occur in the fallopian tube. Rarely do ectopic pregnancies occur on the ovary, intestine, pelvis, or cervix. An ectopic pregnancy does not have the ability to develop into a normal, healthy baby.  A ruptured ectopic pregnancy is one in which the fallopian tube gets torn or bursts and results in internal bleeding. Often there is intense abdominal pain, and sometimes, vaginal bleeding. Having an ectopic pregnancy can be a life-threatening experience. If left untreated, this dangerous condition can lead to a blood transfusion, abdominal surgery, or even death.  CAUSES  Damage to the fallopian tubes is the suspected cause in most ectopic pregnancies.  RISK FACTORS Depending on your circumstances, the amount of risk of having an ectopic pregnancy will vary. There are 3 categories that may help you identify whether you are potentially at risk. High Risk  You have gone through infertility treatment.  You have had a previous ectopic pregnancy.  You have had previous tubal surgery.  You have had previous surgery to have the fallopian tubes tied (tubal ligation).  You have tubal problems or diseases.  You have been exposed to DES. DES is a medicine that was used until 1971 and had effects on babies whose mothers took the medicine.  You become pregnant while using an intrauterine device (IUD) for birth control. Moderate Risk  You have a history of infertility.  You have a history of a sexually transmitted infection (STI).  You have a history of pelvic inflammatory disease (PID).  You have scarring from endometriosis.  You have multiple sexual partners.  You smoke. Low Risk  You have had previous pelvic surgery.  You use vaginal douching.  You became sexually active before 37 years of age. SYMPTOMS An ectopic pregnancy should be suspected in  anyone who has missed a period and has abdominal pain or bleeding.  You may experience normal pregnancy symptoms, such as:  Nausea.  Tiredness.  Breast tenderness.  Symptoms that are not normal include:  Pain with intercourse.  Irregular vaginal bleeding or spotting.  Cramping or pain on one side, or in the lower abdomen.  Fast heartbeat.  Passing out while having a bowel movement.  Symptoms of a ruptured ectopic pregnancy and internal bleeding may include:  Sudden, severe pain in the abdomen and pelvis.  Dizziness or fainting.  Pain in the shoulder area. DIAGNOSIS  Tests that may be performed include:  A pregnancy test.  An ultrasound.  Testing the specific level of pregnancy hormone in the bloodstream.  Taking a sample of uterus tissue (dilation and curettage, D&C).  Surgery to perform a visual exam of the inside of the abdomen using a lighted tube (laparoscopy). TREATMENT  Laparoscopic surgery or abdominal surgery is recommended for a ruptured ectopic pregnancy.   The whole fallopian tube may need to be removed (salpingectomy).  If the tube is not too damaged, the tube may be saved, and the pregnancy will be surgically removed. Intime, the tube may still function.  If you have lost a lot of blood, you may need a blood transfusion.  You may receive a Rho (D) immune globulin shot if you are Rh negative and the father is Rh positive, or if you do not know the Rh type of the father. This is to prevent problems with any future pregnancy. SEEK IMMEDIATE MEDICAL CARE IF:  You have any symptoms of an ectopic or ruptured ectopic pregnancy. This  is a medical emergency. MAKE SURE YOU:  Understand these instructions.  Will watch your condition.  Will get help right away if you are not doing well or get worse.   This information is not intended to replace advice given to you by your health care provider. Make sure you discuss any questions you have with your health  care provider.   Document Released: 11/23/2000 Document Revised: 12/01/2013 Document Reviewed: 09/07/2013 Elsevier Interactive Patient Education 2016 [employer]. Hysterosalpingography Hysterosalpingography is an X-ray exam of the womb (uterus) and fallopian tubes. It can help a doctor find out why a woman is not able to have kids. It can also find lumps (tumors) and other things that are not normal. The test lasts about 15-30 minutes. BEFORE THE PROCEDURE  Schedule the test between day 5 and day 10 of your last period. Day 1 is the first day of your period.  Ask your doctor about changing or stopping your medicines.  Eat and drink as normal.  Pee (urinate) before the test begins. PROCEDURE  You may be given pain medicine or a medicine to relax you (sedative).  You will lie down on an X-ray table with your feet in stirrups.  A device (speculum) will be placed into your vagina. This lets your doctor see inside your vagina to the cervix.  Your cervix will be washed with a soap.  A thin tube will be passed through the cervix and into the womb.  X-ray dye (contrast) will be put into this tube.  X-rays will be taken of the womb and fallopian tubes.  The tube will be taken out. AFTER THE PROCEDURE  Most of the fluid will flow out on its own. Wear a pad if needed.  You may have cramping and a little bleeding. This should go away in 24 hours.  Ask when your test results will be ready. Make sure you get your test results.   This information is not intended to replace advice given to you by your health care provider. Make sure you discuss any questions you have with your health care provider.   Document Released: 12/29/2010 Document Revised: 12/17/2014 Document Reviewed: 05/29/2013 Elsevier Interactive Patient Education [employer].

## 2016-03-13 NOTE — Progress Notes (Signed)
[name], female   DOB: 09/08/1981, 35 y.o.   MRN: [id number] Presents for follow-up. Ruptured left ectopic 3/26 with removal of left fallopian tube, received 3 units of blood. Dr. Kennon Rounds at [facility] did the surgery. Had  SAB 08/2015. Same partner for greater than 9 years. Denies need for STD screen. States is doing better, minimal pain, urinating without difficulty and having regular bowel movements. Denies any vaginal bleeding, urinary symptoms, nausea, or fever. A+ blood type. History of infertility.  Exam: Appears well, abdomen obese, laparoscopic surgical sites intact with minimal  ecchymosis. Bowel sounds present in all 4 quadrants.  One-week postop ruptured ectopic  Plan: CBC, planning to go on a cruise next week, driving to [place], reviewed importance of stopping every 2 hours to walk, reviewed importance of avoiding prolonged sitting, no lifting, continue pelvic rest, avoid excessive alcohol on vacation. Prenatal vitamin daily, schedule appointment with Dr. Toney Rakes to discuss hysterosalpingogram prior to trying to conceive again. Reviewed importance of waiting minimum of 3 months for healing. Keep scheduled follow-up with Dr. Kennon Rounds.

## 2016-03-26 ENCOUNTER — Telehealth: Payer: Self-pay | Admitting: *Deleted

## 2016-03-26 NOTE — Telephone Encounter (Signed)
Pt had laproscopic left salpingectomy three weeks ago and has a post-op check scheduled for Thursday 03-11-16.  Pt states she feels much better and is requesting release to return to work.  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  Commutes 40 min to and from work, states she is comfortable with driving and experiences no pain.  Informed pt she could return to work and will come in on Thursday for follow-up and to receive letter for return to work.

## 2016-03-29 ENCOUNTER — Encounter (INDEPENDENT_AMBULATORY_CARE_PROVIDER_SITE_OTHER): Payer: 59 | Admitting: *Deleted

## 2016-03-29 ENCOUNTER — Ambulatory Visit (INDEPENDENT_AMBULATORY_CARE_PROVIDER_SITE_OTHER): Payer: 59 | Admitting: Family Medicine

## 2016-03-29 ENCOUNTER — Encounter: Payer: Self-pay | Admitting: *Deleted

## 2016-03-29 ENCOUNTER — Encounter: Payer: Self-pay | Admitting: Family Medicine

## 2016-03-29 VITALS — BP 116/81 | HR 114 | Resp 16 | Ht 63.0 in | Wt 227.0 lb

## 2016-03-29 DIAGNOSIS — Z09 Encounter for follow-up examination after completed treatment for conditions other than malignant neoplasm: Secondary | ICD-10-CM

## 2016-03-29 DIAGNOSIS — O009 Unspecified ectopic pregnancy without intrauterine pregnancy: Secondary | ICD-10-CM

## 2016-03-29 NOTE — Progress Notes (Signed)
   Subjective:    [name] is a 35 y.o. female presenting with Follow-up  on 03/29/2016  HPI: Here for f/u following laparoscopic removal of ectopic pregnancy. She is doing much better.  Review of Systems  Constitutional: Negative for fever and chills.  Respiratory: Negative for shortness of breath.   Cardiovascular: Negative for chest pain.  Gastrointestinal: Negative for nausea, vomiting and abdominal pain.  Genitourinary: Negative for dysuria.  Skin: Negative for rash.      Objective:    BP 116/81 mmHg  Pulse 114  Resp 16  Ht 5\' 3"  (1.6 m)  Wt 227 lb (102.967 kg)  BMI 40.22 kg/m2  LMP 03/06/2016 Physical Exam  Constitutional: She is oriented to person, place, and time. She appears well-developed and well-nourished. No distress.  HENT:  Head: Normocephalic and atraumatic.  Eyes: No scleral icterus.  Neck: Neck supple.  Cardiovascular: Normal rate.   Pulmonary/Chest: Effort normal.  Abdominal: Soft.  Neurological: She is alert and oriented to person, place, and time.  Skin: Skin is warm and dry.  Incisions are well healed  Psychiatric: She has a normal mood and affect.        Assessment & Plan:   Problem List Items Addressed This Visit    None    Visit Diagnoses    Postop check    -  Primary      Doing well--resume normal activity  Return if symptoms worsen or fail to improve.  Brennin Durfee S 03/29/2016 10:23 AM

## 2016-04-16 ENCOUNTER — Ambulatory Visit: Payer: 59 | Admitting: Pediatrics

## 2016-07-11 DIAGNOSIS — R635 Abnormal weight gain: Secondary | ICD-10-CM | POA: Diagnosis not present

## 2016-07-11 DIAGNOSIS — N951 Menopausal and female climacteric states: Secondary | ICD-10-CM | POA: Diagnosis not present

## 2016-07-17 DIAGNOSIS — R7303 Prediabetes: Secondary | ICD-10-CM | POA: Diagnosis not present

## 2016-07-17 DIAGNOSIS — E559 Vitamin D deficiency, unspecified: Secondary | ICD-10-CM | POA: Diagnosis not present

## 2016-07-17 DIAGNOSIS — N951 Menopausal and female climacteric states: Secondary | ICD-10-CM | POA: Diagnosis not present

## 2016-07-17 DIAGNOSIS — E782 Mixed hyperlipidemia: Secondary | ICD-10-CM | POA: Diagnosis not present

## 2016-07-25 DIAGNOSIS — E538 Deficiency of other specified B group vitamins: Secondary | ICD-10-CM | POA: Diagnosis not present

## 2016-07-25 DIAGNOSIS — E782 Mixed hyperlipidemia: Secondary | ICD-10-CM | POA: Diagnosis not present

## 2016-07-25 DIAGNOSIS — E669 Obesity, unspecified: Secondary | ICD-10-CM | POA: Diagnosis not present

## 2016-07-25 DIAGNOSIS — R7303 Prediabetes: Secondary | ICD-10-CM | POA: Diagnosis not present

## 2016-08-02 DIAGNOSIS — E538 Deficiency of other specified B group vitamins: Secondary | ICD-10-CM | POA: Diagnosis not present

## 2016-08-02 DIAGNOSIS — E669 Obesity, unspecified: Secondary | ICD-10-CM | POA: Diagnosis not present

## 2016-08-02 DIAGNOSIS — R7303 Prediabetes: Secondary | ICD-10-CM | POA: Diagnosis not present

## 2016-08-02 DIAGNOSIS — E782 Mixed hyperlipidemia: Secondary | ICD-10-CM | POA: Diagnosis not present

## 2016-08-09 DIAGNOSIS — R7303 Prediabetes: Secondary | ICD-10-CM | POA: Diagnosis not present

## 2016-08-09 DIAGNOSIS — E782 Mixed hyperlipidemia: Secondary | ICD-10-CM | POA: Diagnosis not present

## 2016-08-09 DIAGNOSIS — E669 Obesity, unspecified: Secondary | ICD-10-CM | POA: Diagnosis not present

## 2016-08-09 DIAGNOSIS — E538 Deficiency of other specified B group vitamins: Secondary | ICD-10-CM | POA: Diagnosis not present

## 2016-08-16 DIAGNOSIS — E538 Deficiency of other specified B group vitamins: Secondary | ICD-10-CM | POA: Diagnosis not present

## 2016-08-16 DIAGNOSIS — E782 Mixed hyperlipidemia: Secondary | ICD-10-CM | POA: Diagnosis not present

## 2016-08-16 DIAGNOSIS — R7303 Prediabetes: Secondary | ICD-10-CM | POA: Diagnosis not present

## 2016-08-16 DIAGNOSIS — E669 Obesity, unspecified: Secondary | ICD-10-CM | POA: Diagnosis not present

## 2016-12-12 DIAGNOSIS — R05 Cough: Secondary | ICD-10-CM | POA: Diagnosis not present

## 2016-12-25 DIAGNOSIS — R05 Cough: Secondary | ICD-10-CM | POA: Diagnosis not present

## 2017-01-22 DIAGNOSIS — R05 Cough: Secondary | ICD-10-CM | POA: Diagnosis not present

## 2017-01-22 DIAGNOSIS — Z0000 Encounter for general adult medical examination without abnormal findings: Secondary | ICD-10-CM | POA: Diagnosis not present

## 2017-02-05 DIAGNOSIS — R5381 Other malaise: Secondary | ICD-10-CM | POA: Diagnosis not present

## 2017-02-05 DIAGNOSIS — R5383 Other fatigue: Secondary | ICD-10-CM | POA: Diagnosis not present

## 2017-02-13 DIAGNOSIS — R05 Cough: Secondary | ICD-10-CM | POA: Diagnosis not present

## 2017-02-15 ENCOUNTER — Ambulatory Visit (INDEPENDENT_AMBULATORY_CARE_PROVIDER_SITE_OTHER): Payer: BLUE CROSS/BLUE SHIELD | Admitting: Internal Medicine

## 2017-02-15 ENCOUNTER — Encounter: Payer: Self-pay | Admitting: Internal Medicine

## 2017-02-15 ENCOUNTER — Ambulatory Visit (INDEPENDENT_AMBULATORY_CARE_PROVIDER_SITE_OTHER)
Admission: RE | Admit: 2017-02-15 | Discharge: 2017-02-15 | Disposition: A | Discharge status: Home / Self Care | Payer: BLUE CROSS/BLUE SHIELD | Source: Ambulatory Visit | Attending: Internal Medicine | Admitting: Internal Medicine

## 2017-02-15 DIAGNOSIS — R053 Chronic cough: Secondary | ICD-10-CM

## 2017-02-15 DIAGNOSIS — R05 Cough: Secondary | ICD-10-CM

## 2017-02-15 LAB — NITRIC OXIDE: Nitric Oxide: 8

## 2017-02-15 MED ORDER — OMEPRAZOLE-SODIUM BICARBONATE 20-1100 MG PO CAPS: 1.0000 | ORAL_CAPSULE | Freq: Every day | ORAL | 3 refills | Status: DC

## 2017-02-15 MED ORDER — PREDNISONE 10 MG PO TABS: ORAL_TABLET | ORAL | 0 refills | Status: DC

## 2017-02-15 NOTE — Patient Instructions (Addendum)
Chronic cough Cough is from sinus drainage, possible acid reflux, and definite post viral reactive cough All of this is working together to cause cyclical cough/LPR cough or irritable throat/larynx  #Sinus drainage  - star netti pot daily @ night - continujet nasal steroid generic fluticasone inhaler 2 squirts each nostril daily as advised   #Possible Acid Reflux  - take otc zegerid 20mg   1 capsule daily on empty stomach (nurse will send script) - avoid colas, spices, cheeses, spirits, red meats, beer, chocolates, fried foods etc.,   - sleep with head end of bed elevated  - eat small frequent meals  - do not go to bed for 3 hours after last meal    #Cyclical cough  - please choose 2-3 days and observe complete voice rest - no talking or whispering  - at all times there  there is urge to cough, drink water or swallow or sip on throat lozenge  #rule out lung disease and nonspeicifc airway inflammation  -do cxr 2 view 02/15/2017  - Please take prednisone 40 mg x1 day, then 30 mg x1 day, then 20 mg x1 day, then 10 mg x1 day, and then 5 mg x1 day and stop - stop symbicort: No evidene of asthma  #Followup - call in 2 weeks and if unimproved then will call in gabapentin and voice rehab - regardless return to see me or APP Tammy in 4-6 weeks.  - any problems call or come sooner

## 2017-02-15 NOTE — Progress Notes (Signed)
Subjective:    [name], female    DOB: [birth year], 36 y.o.   MRN: [id number]  PCP Marda Stalker, PA-C  HPI  IOV 02/15/2017  Chief Complaint  Patient presents with  . Pulmonary Consult    Pt self referred for chronic cough x 3 months. Pt states the cough is nonprod but states she feels she has mucus in her chest. Pt states she has sinus drainage. Pt states she just started taking the Symbicort 80 on 3/6 and states she has noticed a slight difference in the frequency of the cough. Pt c/o midsternal CP when coughing, pt states the vicks vapor rub helps relieve.       36 year old [place]. 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 Are new year 2018 she did pick up a respiratory infection and subsequently a second one. Since then she's had significant persistent cough that is severe. RSI cough score is 39. Cough is present in the daytime and at night she takes according cough syrup that helps is able to sleep undisturbed. She thinks she might have wheezing as well. This was associated dyspnea hemoptysis. Quality of cough is dry, there is associated clearing of the throat, barking quality to the cough and a feeling of fecal in the throat. She continues to have sinus drainage for which she is taking fluticasone. She's not had any imaging. She started antibiotic without any success. She's not been on prednisone. This no prior history of asthma or acid reflux and sinus drainage and spring allergies. This the first episode of severe cough. RSI cough score is over 30 and suggests significant irritable larynx syndrome.    Dr Lorenza Cambridge Reflux Symptom Index (> 13-15 suggestive of LPR cough) 0 -> 5  =  none ->severe problem 02/15/2017   Hoarseness of problem with voice 5  Clearing  Of Throat 4  Excess throat mucus or feeling of post nasal drip 3  Difficulty swallowing food, liquid or tablets 5  Cough after eating or lying down 5    Breathing difficulties or choking episodes 3  Troublesome or annoying cough 4  Sensation of something sticking in throat or lump in throat 5  Heartburn, chest pain, indigestion, or stomach acid coming up 5  TOTAL 39     feno 02/15/2017 - 8ppb and normal  Results for Patsey Berthold Darrelle (MRN [id number]) as of 02/15/2017 11:23  Ref. Range 03/13/2016 14:59  Hemoglobin Latest Ref Range: 11.7 - 15.5 g/dL 11.9  Results for VANNAH, NADAL (MRN [id number]) as of 02/15/2017 11:23  Ref. Range 03/13/2016 14:59  Eosinophils Absolute Latest Ref Range: 15 - 500 cells/uL 244     has a past medical history of Gallstones.   reports that she quit smoking about 25 years ago. Her smoking use included Cigarettes. She has a 0.50 pack-year smoking history. She has never used smokeless tobacco.  Past Surgical History:  Procedure Laterality Date  . CHOLECYSTECTOMY  03/05/2012   Procedure: LAPAROSCOPIC CHOLECYSTECTOMY WITH INTRAOPERATIVE CHOLANGIOGRAM;  Surgeon: Imogene Burn. Georgette Dover, MD;  Location: [facility];  Service: General;  Laterality: N/A;  . DIAGNOSTIC LAPAROSCOPY WITH REMOVAL OF ECTOPIC PREGNANCY N/A 03/04/2016   Procedure: DIAGNOSTIC LAPAROSCOPY WITH REMOVAL OF ECTOPIC PREGNANCY;  Surgeon: Donnamae Jude, MD;  Location: [facility];  Service: Gynecology;  Laterality: N/A;  . DILATION AND CURETTAGE OF UTERUS N/A 09/02/2015   Procedure: Suction DILATATION AND CURETTAGE;  Surgeon: Waymon Amato, MD;  Location: [facility];  Service: Gynecology;  Laterality: N/A;  . PELVIC LAPAROSCOPY     cystectomy    No Known Allergies  Immunization History  Administered Date(s) Administered  . Hepatitis B, adult 12/31/2011, 04/01/2012, 08/07/2012  . Influenza Split 09/13/2010  . Tdap 09/30/2008    Family History  Problem Relation Age of Onset  . Stroke Mother   . Seizures Mother   . Hypertension Mother   . Heart disease Mother   . Diabetes Father   . Heart disease Father     has had two heart attacks     Current Outpatient  Prescriptions:  .  acetaminophen (TYLENOL) 325 MG tablet, Take 650 mg by mouth every 6 (six) hours as needed for headache., Disp: , Rfl:  .  albuterol (PROVENTIL HFA;VENTOLIN HFA) 108 (90 Base) MCG/ACT inhaler, Inhale 1-2 puffs into the lungs every 6 (six) hours as needed for wheezing or shortness of breath., Disp: , Rfl:  .  budesonide-formoterol (SYMBICORT) 80-4.5 MCG/ACT inhaler, Inhale 2 puffs into the lungs 2 (two) times daily., Disp: , Rfl:  .  Multiple Vitamin (MULTIVITAMIN) capsule, Take 1 capsule by mouth daily., Disp: , Rfl:    Review of Systems  Constitutional: Negative for fever and unexpected weight change.  HENT: Negative for congestion, dental problem, ear pain, nosebleeds, postnasal drip, rhinorrhea, sinus pressure, sneezing, sore throat and trouble swallowing.   Eyes: Negative for redness and itching.  Respiratory: Positive for cough. Negative for chest tightness, shortness of breath and wheezing.   Cardiovascular: Negative for palpitations and leg swelling.  Gastrointestinal: Negative for nausea and vomiting.  Genitourinary: Negative for dysuria.  Musculoskeletal: Negative for joint swelling.  Skin: Negative for rash.  Neurological: Negative for headaches.  Hematological: Does not bruise/bleed easily.  Psychiatric/Behavioral: Negative for dysphoric mood. The patient is not nervous/anxious.        Objective:   Physical Exam  Constitutional: She is oriented to person, place, and time. She appears well-developed and well-nourished. No distress.  obese  HENT:  Head: Normocephalic and atraumatic.  Right Ear: External ear normal.  Left Ear: External ear normal.  Mouth/Throat: Oropharynx is clear and moist. No oropharyngeal exudate.  High pitched voice + Barking cough + Nasal twang + mallampatti clas 3 Mild post nasal drip +  Eyes: Conjunctivae and EOM are normal. Pupils are equal, round, and reactive to light. Right eye exhibits no discharge. Left eye exhibits no  discharge. No scleral icterus.  Neck: Normal range of motion. Neck supple. No JVD present. No tracheal deviation present. No thyromegaly present.  Cardiovascular: Normal rate, regular rhythm, normal heart sounds and intact distal pulses.  Exam reveals no gallop and no friction rub.   No murmur heard. Pulmonary/Chest: Effort normal and breath sounds normal. No respiratory distress. She has no wheezes. She has no rales. She exhibits no tenderness.  Abdominal: Soft. Bowel sounds are normal. She exhibits no distension and no mass. There is no tenderness. There is no rebound and no guarding.  Musculoskeletal: Normal range of motion. She exhibits no edema or tenderness.  Lymphadenopathy:    She has no cervical adenopathy.  Neurological: She is alert and oriented to person, place, and time. She has normal reflexes. No cranial nerve deficit. She exhibits normal muscle tone. Coordination normal.  Skin: Skin is warm and dry. No rash noted. She is not diaphoretic. No erythema. No pallor.  Psychiatric: She has a normal mood and affect. Her behavior is normal. Judgment and thought content normal.  Vitals reviewed.   Vitals:  02/15/17 1057  BP: 124/90  Pulse: (!) 118  SpO2: 96%  Weight: 229 lb (103.9 kg)  Height: 5\' 3"  (1.6 m)    Estimated body mass index is 40.57 kg/m as calculated from the following:   Height as of this encounter: 5\' 3"  (1.6 m).   Weight as of this encounter: 229 lb (103.9 kg).       Assessment & Plan:  Chronic cough Cough is from sinus drainage, possible acid reflux, and definite post viral reactive cough All of this is working together to cause cyclical cough/LPR cough or irritable throat/larynx  #Sinus drainage  - star netti pot daily @ night - continujet nasal steroid generic fluticasone inhaler 2 squirts each nostril daily as advised   #Possible Acid Reflux  - take otc zegerid 20mg   1 capsule daily on empty stomach (nurse will send script) - avoid colas, spices,  cheeses, spirits, red meats, beer, chocolates, fried foods etc.,   - sleep with head end of bed elevated  - eat small frequent meals  - do not go to bed for 3 hours after last meal    #Cyclical cough  - please choose 2-3 days and observe complete voice rest - no talking or whispering  - at all times there  there is urge to cough, drink water or swallow or sip on throat lozenge  #rule out lung disease and nonspeicifc airway inflammation  -do cxr 2 view 02/15/2017  - Please take prednisone 40 mg x1 day, then 30 mg x1 day, then 20 mg x1 day, then 10 mg x1 day, and then 5 mg x1 day and stop - stop symbicort: No evidene of asthma  #Followup - call in 2 weeks and if unimproved then will call in gabapentin and voice rehab - regardless return to see me or APP Tammy in 4-6 weeks.  - any problems call or come sooner     Dr. Brand Males, M.D., [facility].C.P Pulmonary and Critical Care Medicine Staff Physician [facility] Pager: [phone], If no answer or between  15:00h - 7:00h: call [phone]  02/15/2017 11:41 AM

## 2017-02-15 NOTE — Assessment & Plan Note (Addendum)
Cough is from sinus drainage, possible acid reflux, and definite post viral reactive cough All of this is working together to cause cyclical cough/LPR cough or irritable throat/larynx  #Sinus drainage  - star netti pot daily @ night - continujet nasal steroid generic fluticasone inhaler 2 squirts each nostril daily as advised   #Possible Acid Reflux  - take otc zegerid 20mg   1 capsule daily on empty stomach (nurse will send script) - avoid colas, spices, cheeses, spirits, red meats, beer, chocolates, fried foods etc.,   - sleep with head end of bed elevated  - eat small frequent meals  - do not go to bed for 3 hours after last meal    #Cyclical cough  - please choose 2-3 days and observe complete voice rest - no talking or whispering  - at all times there  there is urge to cough, drink water or swallow or sip on throat lozenge  #rule out lung disease and nonspeicifc airway inflammation  -do cxr 2 view 02/15/2017  - Please take prednisone 40 mg x1 day, then 30 mg x1 day, then 20 mg x1 day, then 10 mg x1 day, and then 5 mg x1 day and stop - stop symbicort: No evidene of asthma  #Followup - call in 2 weeks and if unimproved then will call in gabapentin and voice rehab - regardless return to see me or APP Tammy in 4-6 weeks.  - any problems call or come sooner

## 2017-02-15 NOTE — Addendum Note (Signed)
Addended by: Collier Salina on: 02/15/2017 12:29 PM   Modules accepted: Orders

## 2017-02-20 NOTE — Progress Notes (Signed)
lmtcb for pt.  

## 2017-02-21 ENCOUNTER — Telehealth: Payer: Self-pay | Admitting: Internal Medicine

## 2017-02-21 NOTE — Telephone Encounter (Signed)
Notes Recorded by Brand Males, MD on 02/15/2017 at 2:21 PM EST Normal cxr ------------------- Spoke with pt. She is aware of results. Nothing further was needed.

## 2017-03-06 ENCOUNTER — Institutional Professional Consult (permissible substitution): Payer: 59 | Admitting: Pulmonary Disease

## 2017-03-06 DIAGNOSIS — J040 Acute laryngitis: Secondary | ICD-10-CM | POA: Diagnosis not present

## 2017-03-06 DIAGNOSIS — J320 Chronic maxillary sinusitis: Secondary | ICD-10-CM | POA: Diagnosis not present

## 2017-03-06 DIAGNOSIS — R05 Cough: Secondary | ICD-10-CM | POA: Diagnosis not present

## 2017-03-06 DIAGNOSIS — J322 Chronic ethmoidal sinusitis: Secondary | ICD-10-CM | POA: Diagnosis not present

## 2017-03-06 DIAGNOSIS — J039 Acute tonsillitis, unspecified: Secondary | ICD-10-CM | POA: Diagnosis not present

## 2017-03-15 ENCOUNTER — Ambulatory Visit: Payer: BLUE CROSS/BLUE SHIELD | Admitting: Adult Health

## 2017-03-15 DIAGNOSIS — R05 Cough: Secondary | ICD-10-CM | POA: Diagnosis not present

## 2017-03-15 DIAGNOSIS — J410 Simple chronic bronchitis: Secondary | ICD-10-CM | POA: Diagnosis not present

## 2017-03-15 DIAGNOSIS — J320 Chronic maxillary sinusitis: Secondary | ICD-10-CM | POA: Diagnosis not present

## 2017-03-15 DIAGNOSIS — J029 Acute pharyngitis, unspecified: Secondary | ICD-10-CM | POA: Diagnosis not present

## 2017-04-09 DIAGNOSIS — R0602 Shortness of breath: Secondary | ICD-10-CM | POA: Diagnosis not present

## 2017-04-09 DIAGNOSIS — Z7189 Other specified counseling: Secondary | ICD-10-CM | POA: Diagnosis not present

## 2017-04-09 DIAGNOSIS — R5383 Other fatigue: Secondary | ICD-10-CM | POA: Diagnosis not present

## 2017-04-09 DIAGNOSIS — Z6841 Body Mass Index (BMI) 40.0 and over, adult: Secondary | ICD-10-CM | POA: Diagnosis not present

## 2017-04-24 ENCOUNTER — Encounter: Payer: Self-pay | Admitting: Gynecology

## 2017-05-14 DIAGNOSIS — Z7189 Other specified counseling: Secondary | ICD-10-CM | POA: Diagnosis not present

## 2017-05-14 DIAGNOSIS — Z6841 Body Mass Index (BMI) 40.0 and over, adult: Secondary | ICD-10-CM | POA: Diagnosis not present

## 2017-07-04 ENCOUNTER — Encounter: Payer: 59 | Admitting: Gynecology

## 2017-08-27 ENCOUNTER — Encounter: Payer: 59 | Admitting: Women's Health

## 2017-10-02 ENCOUNTER — Encounter: Payer: Self-pay | Admitting: Women's Health

## 2017-10-02 ENCOUNTER — Ambulatory Visit (INDEPENDENT_AMBULATORY_CARE_PROVIDER_SITE_OTHER): Payer: BLUE CROSS/BLUE SHIELD | Admitting: Women's Health

## 2017-10-02 VITALS — BP 122/78 | Ht 63.0 in | Wt 239.0 lb

## 2017-10-02 DIAGNOSIS — N926 Irregular menstruation, unspecified: Secondary | ICD-10-CM

## 2017-10-02 DIAGNOSIS — Z01419 Encounter for gynecological examination (general) (routine) without abnormal findings: Secondary | ICD-10-CM

## 2017-10-02 NOTE — Addendum Note (Signed)
Addended by: Lorine Bears on: 10/02/2017 04:19 PM   Modules accepted: Orders

## 2017-10-02 NOTE — Progress Notes (Signed)
Becky Everett December 10, 1981 [id number]    History:    Presents for annual exam.  Monthly cycle, every other month light spotting only with cycle. history of infertility, no contraception for greater than 10 years, same partner. 02/2016 ruptured ectopic resulting in left salpingectomy. SAB 2016. History of a normal hysterosalpingogram.  Past medical history, past surgical history, family history and social history were all reviewed and documented in the EPIC chart. Works at a [employer], corporate reception. History of a cholecystectomy. 2013 endometrial polyp. Sister gastric bypass.  ROS:  A ROS was performed and pertinent positives and negatives are included.  Exam:  Vitals:   10/02/17 1411  Weight: 239 lb (108.4 kg)  Height: 5\' 3"  (1.6 m)   Body mass index is 42.34 kg/m.   General appearance:  Normal Thyroid:  Symmetrical, normal in size, without palpable masses or nodularity. Respiratory  Auscultation:  Clear without wheezing or rhonchi Cardiovascular  Auscultation:  Regular rate, without rubs, murmurs or gallops  Edema/varicosities:  Not grossly evident Abdominal  Soft,nontender, without masses, guarding or rebound.  Liver/spleen:  No organomegaly noted  Hernia:  None appreciated  Skin  Inspection:  Grossly normal   Breasts: Examined lying and sitting.     Right: Without masses, retractions, discharge or axillary adenopathy.     Left: Without masses, retractions, discharge or axillary adenopathy. Gentitourinary   Inguinal/mons:  Normal without inguinal adenopathy  External genitalia:  Normal  BUS/Urethra/Skene's glands:  Normal  Vagina:  Normal  Cervix:  Normal  Uterus: normal in size, shape and contour.  Midline and mobile  Adnexa/parametria:     Rt: Without masses or tenderness.   Lt: Without masses or tenderness.  Anus and perineum: Normal  Digital rectal exam: Normal sphincter tone without palpated masses or tenderness  Assessment/Plan:  36 y.o. and HF G2 P0 for  annual exam desiring conception  Monthly cycle, cycle is light spotting every other month  Infertility Obesity Asthma-primary care  Plan: Referral for fertility management. Encouraged increased frequency of intercourse. Fertility awareness reviewed. SBE's, annual screening mammogram at 40, increase regular exercise and decrease calorie/carbs, MVI daily encouraged. CBC, TSH, prolactin, glucose, Pap with HR HPV typing, new screening guidelines reviewed.  [place], 2:14 PM 10/02/2017

## 2017-10-02 NOTE — Patient Instructions (Addendum)
Health Maintenance, Female Adopting a healthy lifestyle and getting preventive care can go a long way to promote health and wellness. Talk with your health care provider about what schedule of regular examinations is right for you. This is a good chance for you to check in with your provider about disease prevention and staying healthy. In between checkups, there are plenty of things you can do on your own. Experts have done a lot of research about which lifestyle changes and preventive measures are most likely to keep you healthy. Ask your health care provider for more information. Weight and diet Eat a healthy diet  Be sure to include plenty of vegetables, fruits, low-fat dairy products, and lean protein.  Do not eat a lot of foods high in solid fats, added sugars, or salt.  Get regular exercise. This is one of the most important things you can do for your health. ? Most adults should exercise for at least 150 minutes each week. The exercise should increase your heart rate and make you sweat (moderate-intensity exercise). ? Most adults should also do strengthening exercises at least twice a week. This is in addition to the moderate-intensity exercise.  Maintain a healthy weight  Body mass index (BMI) is a measurement that can be used to identify possible weight problems. It estimates body fat based on height and weight. Your health care provider can help determine your BMI and help you achieve or maintain a healthy weight.  For females 20 years of age and older: ? A BMI below 18.5 is considered underweight. ? A BMI of 18.5 to 24.9 is normal. ? A BMI of 25 to 29.9 is considered overweight. ? A BMI of 30 and above is considered obese.  Watch levels of cholesterol and blood lipids  You should start having your blood tested for lipids and cholesterol at 38 years of age, then have this test every 5 years.  You may need to have your cholesterol levels checked more often if: ? Your lipid or  cholesterol levels are high. ? You are older than 38 years of age. ? You are at high risk for heart disease.  Cancer screening Lung Cancer  Lung cancer screening is recommended for adults 55-80 years old who are at high risk for lung cancer because of a history of smoking.  A yearly low-dose CT scan of the lungs is recommended for people who: ? Currently smoke. ? Have quit within the past 15 years. ? Have at least a 30-pack-year history of smoking. A pack year is smoking an average of one pack of cigarettes a day for 1 year.  Yearly screening should continue until it has been 15 years since you quit.  Yearly screening should stop if you develop a health problem that would prevent you from having lung cancer treatment.  Breast Cancer  Practice breast self-awareness. This means understanding how your breasts normally appear and feel.  It also means doing regular breast self-exams. Let your health care provider know about any changes, no matter how small.  If you are in your 20s or 30s, you should have a clinical breast exam (CBE) by a health care provider every 1-3 years as part of a regular health exam.  If you are 40 or older, have a CBE every year. Also consider having a breast X-ray (mammogram) every year.  If you have a family history of breast cancer, talk to your health care provider about genetic screening.  If you are at high risk   for breast cancer, talk to your health care provider about having an MRI and a mammogram every year.  Breast cancer gene (BRCA) assessment is recommended for women who have family members with BRCA-related cancers. BRCA-related cancers include: ? Breast. ? Ovarian. ? Tubal. ? Peritoneal cancers.  Results of the assessment will determine the need for genetic counseling and BRCA1 and BRCA2 testing.  Cervical Cancer Your health care provider may recommend that you be screened regularly for cancer of the pelvic organs (ovaries, uterus, and  vagina). This screening involves a pelvic examination, including checking for microscopic changes to the surface of your cervix (Pap test). You may be encouraged to have this screening done every 3 years, beginning at age 22.  For women ages 56-65, health care providers may recommend pelvic exams and Pap testing every 3 years, or they may recommend the Pap and pelvic exam, combined with testing for human papilloma virus (HPV), every 5 years. Some types of HPV increase your risk of cervical cancer. Testing for HPV may also be done on women of any age with unclear Pap test results.  Other health care providers may not recommend any screening for nonpregnant women who are considered low risk for pelvic cancer and who do not have symptoms. Ask your health care provider if a screening pelvic exam is right for you.  If you have had past treatment for cervical cancer or a condition that could lead to cancer, you need Pap tests and screening for cancer for at least 20 years after your treatment. If Pap tests have been discontinued, your risk factors (such as having a new sexual partner) need to be reassessed to determine if screening should resume. Some women have medical problems that increase the chance of getting cervical cancer. In these cases, your health care provider may recommend more frequent screening and Pap tests.  Colorectal Cancer  This type of cancer can be detected and often prevented.  Routine colorectal cancer screening usually begins at 38 years of age and continues through 38 years of age.  Your health care provider may recommend screening at an earlier age if you have risk factors for colon cancer.  Your health care provider may also recommend using home test kits to check for hidden blood in the stool.  A small camera at the end of a tube can be used to examine your colon directly (sigmoidoscopy or colonoscopy). This is done to check for the earliest forms of colorectal  cancer.  Routine screening usually begins at age 33.  Direct examination of the colon should be repeated every 5-10 years through 38 years of age. However, you may need to be screened more often if early forms of precancerous polyps or small growths are found.  Skin Cancer  Check your skin from head to toe regularly.  Tell your health care provider about any new moles or changes in moles, especially if there is a change in a mole's shape or color.  Also tell your health care provider if you have a mole that is larger than the size of a pencil eraser.  Always use sunscreen. Apply sunscreen liberally and repeatedly throughout the day.  Protect yourself by wearing long sleeves, pants, a wide-brimmed hat, and sunglasses whenever you are outside.  Heart disease, diabetes, and high blood pressure  High blood pressure causes heart disease and increases the risk of stroke. High blood pressure is more likely to develop in: ? People who have blood pressure in the high end of  the normal range (130-139/85-89 mm Hg). ? People who are overweight or obese. ? People who are African American.  If you are 21-29 years of age, have your blood pressure checked every 3-5 years. If you are 3 years of age or older, have your blood pressure checked every year. You should have your blood pressure measured twice-once when you are at a hospital or clinic, and once when you are not at a hospital or clinic. Record the average of the two measurements. To check your blood pressure when you are not at a hospital or clinic, you can use: ? An automated blood pressure machine at a pharmacy. ? A home blood pressure monitor.  If you are between 17 years and 37 years old, ask your health care provider if you should take aspirin to prevent strokes.  Have regular diabetes screenings. This involves taking a blood sample to check your fasting blood sugar level. ? If you are at a normal weight and have a low risk for diabetes,  have this test once every three years after 38 years of age. ? If you are overweight and have a high risk for diabetes, consider being tested at a younger age or more often. Preventing infection Hepatitis B  If you have a higher risk for hepatitis B, you should be screened for this virus. You are considered at high risk for hepatitis B if: ? You were born in a country where hepatitis B is common. Ask your health care provider which countries are considered high risk. ? Your parents were born in a high-risk country, and you have not been immunized against hepatitis B (hepatitis B vaccine). ? You have HIV or AIDS. ? You use needles to inject street drugs. ? You live with someone who has hepatitis B. ? You have had sex with someone who has hepatitis B. ? You get hemodialysis treatment. ? You take certain medicines for conditions, including cancer, organ transplantation, and autoimmune conditions.  Hepatitis C  Blood testing is recommended for: ? Everyone born from [birth year] through 1967. ? Anyone with known risk factors for hepatitis C.  Sexually transmitted infections (STIs)  You should be screened for sexually transmitted infections (STIs) including gonorrhea and chlamydia if: ? You are sexually active and are younger than 38 years of age. ? You are older than 38 years of age and your health care provider tells you that you are at risk for this type of infection. ? Your sexual activity has changed since you were last screened and you are at an increased risk for chlamydia or gonorrhea. Ask your health care provider if you are at risk.  If you do not have HIV, but are at risk, it may be recommended that you take a prescription medicine daily to prevent HIV infection. This is called pre-exposure prophylaxis (PrEP). You are considered at risk if: ? You are sexually active and do not regularly use condoms or know the HIV status of your partner(s). ? You take drugs by injection. ? You are  sexually active with a partner who has HIV.  Talk with your health care provider about whether you are at high risk of being infected with HIV. If you choose to begin PrEP, you should first be tested for HIV. You should then be tested every 3 months for as long as you are taking PrEP. Pregnancy  If you are premenopausal and you may become pregnant, ask your health care provider about preconception counseling.  If you may become  pregnant, take 400 to 800 micrograms (mcg) of folic acid every day.  If you want to prevent pregnancy, talk to your health care provider about birth control (contraception). Osteoporosis and menopause  Osteoporosis is a disease in which the bones lose minerals and strength with aging. This can result in serious bone fractures. Your risk for osteoporosis can be identified using a bone density scan.  If you are 65 years of age or older, or if you are at risk for osteoporosis and fractures, ask your health care provider if you should be screened.  Ask your health care provider whether you should take a calcium or vitamin D supplement to lower your risk for osteoporosis.  Menopause may have certain physical symptoms and risks.  Hormone replacement therapy may reduce some of these symptoms and risks. Talk to your health care provider about whether hormone replacement therapy is right for you. Follow these instructions at home:  Schedule regular health, dental, and eye exams.  Stay current with your immunizations.  Do not use any tobacco products including cigarettes, chewing tobacco, or electronic cigarettes.  If you are pregnant, do not drink alcohol.  If you are breastfeeding, limit how much and how often you drink alcohol.  Limit alcohol intake to no more than 1 drink per day for nonpregnant women. One drink equals 12 ounces of beer, 5 ounces of wine, or 1 ounces of hard liquor.  Do not use street drugs.  Do not share needles.  Ask your health care  provider for help if you need support or information about quitting drugs.  Tell your health care provider if you often feel depressed.  Tell your health care provider if you have ever been abused or do not feel safe at home. This information is not intended to replace advice given to you by your health care provider. Make sure you discuss any questions you have with your health care provider. Document Released: 06/11/2011 Document Revised: 05/03/2016 Document Reviewed: 08/30/2015 Elsevier Interactive Patient Education  2018 [employer]  Carbohydrate Counting for Diabetes Mellitus, Adult Carbohydrate counting is a method for keeping track of how many carbohydrates you eat. Eating carbohydrates naturally increases the amount of sugar (glucose) in the blood. Counting how many carbohydrates you eat helps keep your blood glucose within normal limits, which helps you manage your diabetes (diabetes mellitus). It is important to know how many carbohydrates you can safely have in each meal. This is different for every person. A diet and nutrition specialist (registered dietitian) can help you make a meal plan and calculate how many carbohydrates you should have at each meal and snack. Carbohydrates are found in the following foods:  Grains, such as breads and cereals.  Dried beans and soy products.  Starchy vegetables, such as potatoes, peas, and corn.  Fruit and fruit juices.  Milk and yogurt.  Sweets and snack foods, such as cake, cookies, candy, chips, and soft drinks.  How do I count carbohydrates? There are two ways to count carbohydrates in food. You can use either of the methods or a combination of both. Reading "Nutrition Facts" on packaged food The "Nutrition Facts" list is included on the labels of almost all packaged foods and beverages in the [place] It includes:  The serving size.  Information about nutrients in each serving, including the grams (g) of carbohydrate per  serving.  To use the "Nutrition Facts":  Decide how many servings you will have.  Multiply the number of servings by the number of   carbohydrates per serving.  The resulting number is the total amount of carbohydrates that you will be having.  Learning standard serving sizes of other foods When you eat foods containing carbohydrates that are not packaged or do not include "Nutrition Facts" on the label, you need to measure the servings in order to count the amount of carbohydrates:  Measure the foods that you will eat with a food scale or measuring cup, if needed.  Decide how many standard-size servings you will eat.  Multiply the number of servings by 15. Most carbohydrate-rich foods have about 15 g of carbohydrates per serving. ? For example, if you eat 8 oz (170 g) of strawberries, you will have eaten 2 servings and 30 g of carbohydrates (2 servings x 15 g = 30 g).  For foods that have more than one food mixed, such as soups and casseroles, you must count the carbohydrates in each food that is included.  The following list contains standard serving sizes of common carbohydrate-rich foods. Each of these servings has about 15 g of carbohydrates:   hamburger bun or  English muffin.   oz (15 mL) syrup.   oz (14 g) jelly.  1 slice of bread.  1 six-inch tortilla.  3 oz (85 g) cooked rice or pasta.  4 oz (113 g) cooked dried beans.  4 oz (113 g) starchy vegetable, such as peas, corn, or potatoes.  4 oz (113 g) hot cereal.  4 oz (113 g) mashed potatoes or  of a large baked potato.  4 oz (113 g) canned or frozen fruit.  4 oz (120 mL) fruit juice.  4-6 crackers.  6 chicken nuggets.  6 oz (170 g) unsweetened dry cereal.  6 oz (170 g) plain fat-free yogurt or yogurt sweetened with artificial sweeteners.  8 oz (240 mL) milk.  8 oz (170 g) fresh fruit or one small piece of fruit.  24 oz (680 g) popped popcorn.  Example of carbohydrate counting Sample meal  3  oz (85 g) chicken breast.  6 oz (170 g) brown rice.  4 oz (113 g) corn.  8 oz (240 mL) milk.  8 oz (170 g) strawberries with sugar-free whipped topping. Carbohydrate calculation 1. Identify the foods that contain carbohydrates: ? Rice. ? Corn. ? Milk. ? Strawberries. 2. Calculate how many servings you have of each food: ? 2 servings rice. ? 1 serving corn. ? 1 serving milk. ? 1 serving strawberries. 3. Multiply each number of servings by 15 g: ? 2 servings rice x 15 g = 30 g. ? 1 serving corn x 15 g = 15 g. ? 1 serving milk x 15 g = 15 g. ? 1 serving strawberries x 15 g = 15 g. 4. Add together all of the amounts to find the total grams of carbohydrates eaten: ? 30 g + 15 g + 15 g + 15 g = 75 g of carbohydrates total. This information is not intended to replace advice given to you by your health care provider. Make sure you discuss any questions you have with your health care provider. Document Released: 11/26/2005 Document Revised: 06/15/2016 Document Reviewed: 05/09/2016 Elsevier Interactive Patient Education  [employer].

## 2017-10-03 ENCOUNTER — Telehealth: Payer: Self-pay | Admitting: *Deleted

## 2017-10-03 ENCOUNTER — Encounter: Payer: Self-pay | Admitting: Women's Health

## 2017-10-03 LAB — CBC WITH DIFFERENTIAL/PLATELET
BASOS PCT: 0.5 %
Basophils Absolute: 48 cells/uL (ref 0–200)
Eosinophils Absolute: 192 cells/uL (ref 15–500)
Eosinophils Relative: 2 %
HCT: 35.6 % (ref 35.0–45.0)
Hemoglobin: 12.3 g/dL (ref 11.7–15.5)
Lymphs Abs: 2842 cells/uL (ref 850–3900)
MCH: 28.7 pg (ref 27.0–33.0)
MCHC: 34.6 g/dL (ref 32.0–36.0)
MCV: 83 fL (ref 80.0–100.0)
MPV: 11.7 fL (ref 7.5–12.5)
Monocytes Relative: 4.5 %
Neutro Abs: 6086 cells/uL (ref 1500–7800)
Neutrophils Relative %: 63.4 %
PLATELETS: 374 10*3/uL (ref 140–400)
RBC: 4.29 10*6/uL (ref 3.80–5.10)
RDW: 13.4 % (ref 11.0–15.0)
TOTAL LYMPHOCYTE: 29.6 %
WBC: 9.6 10*3/uL (ref 3.8–10.8)
WBCMIX: 432 {cells}/uL (ref 200–950)

## 2017-10-03 LAB — PROLACTIN: Prolactin: 9.5 ng/mL

## 2017-10-03 LAB — GLUCOSE, RANDOM: GLUCOSE: 96 mg/dL (ref 65–99)

## 2017-10-03 LAB — TSH: TSH: 1.57 mIU/L

## 2017-10-03 NOTE — Telephone Encounter (Signed)
Referral faxed to [place] office they will contact pt to schedule. And fax me back with time and date.

## 2017-10-03 NOTE — Telephone Encounter (Signed)
-----   Message from Huel Cote, NP sent at 10/02/2017  2:18 PM EDT ----- Please schedule fertility management hx of LSO for ruptured ectopic, hx SAB. Monthly some months only spotting.  No contraception for 10 years

## 2017-10-04 DIAGNOSIS — J0100 Acute maxillary sinusitis, unspecified: Secondary | ICD-10-CM | POA: Diagnosis not present

## 2017-10-10 LAB — PAP, TP IMAGING W/ HPV RNA, RFLX HPV TYPE 16,18/45: HPV DNA HIGH RISK: NOT DETECTED

## 2017-10-16 NOTE — Telephone Encounter (Signed)
Dr[facility] office tried to contact pt several times, no return phone call

## 2017-12-10 HISTORY — PX: GASTRIC BYPASS: SHX52

## 2018-01-21 ENCOUNTER — Ambulatory Visit: Payer: BLUE CROSS/BLUE SHIELD | Admitting: Women's Health

## 2018-01-21 ENCOUNTER — Encounter: Payer: Self-pay | Admitting: Women's Health

## 2018-01-21 VITALS — BP 120/80

## 2018-01-21 DIAGNOSIS — Z30011 Encounter for initial prescription of contraceptive pills: Secondary | ICD-10-CM | POA: Diagnosis not present

## 2018-01-21 DIAGNOSIS — Z9884 Bariatric surgery status: Secondary | ICD-10-CM

## 2018-01-21 MED ORDER — NORETHINDRONE ACET-ETHINYL EST 1-20 MG-MCG PO TABS: 1.0000 | ORAL_TABLET | Freq: Every day | ORAL | 4 refills | Status: DC

## 2018-01-21 NOTE — Patient Instructions (Signed)
Ethinyl Estradiol; Norethindrone Acetate; Ferrous fumarate tablets or capsules What is this medicine? ETHINYL ESTRADIOL; NORETHINDRONE ACETATE; FERROUS FUMARATE (ETH in il es tra DYE ole; nor eth IN drone AS e tate; FER us FUE ma rate) is an oral contraceptive. The products combine two types of female hormones, an estrogen and a progestin. They are used to prevent ovulation and pregnancy. Some products are also used to treat acne in females. This medicine may be used for other purposes; ask your health care provider or pharmacist if you have questions. COMMON BRAND NAME(S): Blisovi 24 Fe, Blisovi Fe, Estrostep Fe, Gildess 24 Fe, Gildess Fe 1.5/30, Gildess Fe 1/20, Junel Fe 1.5/30, Junel Fe 1/20, Junel Fe 24, Larin Fe, Lo Loestrin Fe, Loestrin 24 Fe, Loestrin FE 1.5/30, Loestrin FE 1/20, Lomedia 24 Fe, Microgestin 24 Fe, Microgestin Fe 1.5/30, Microgestin Fe 1/20, Tarina Fe 1/20, Taytulla, Tilia Fe, Tri-Legest Fe What should I tell my health care provider before I take this medicine? They need to know if you have any of these conditions: -abnormal vaginal bleeding -blood vessel disease -breast, cervical, endometrial, ovarian, liver, or uterine cancer -diabetes -gallbladder disease -heart disease or recent heart attack -high blood pressure -high cholesterol -history of blood clots -kidney disease -liver disease -migraine headaches -smoke tobacco -stroke -systemic lupus erythematosus (SLE) -an unusual or allergic reaction to estrogens, progestins, other medicines, foods, dyes, or preservatives -pregnant or trying to get pregnant -breast-feeding How should I use this medicine? Take this medicine by mouth. To reduce nausea, this medicine may be taken with food. Follow the directions on the prescription label. Take this medicine at the same time each day and in the order directed on the package. Do not take your medicine more often than directed. A patient package insert for the product will be  given with each prescription and refill. Read this sheet carefully each time. The sheet may change frequently. Contact your pediatrician regarding the use of this medicine in children. Special care may be needed. This medicine has been used in female children who have started having menstrual periods. Overdosage: If you think you have taken too much of this medicine contact a poison control center or emergency room at once. NOTE: This medicine is only for you. Do not share this medicine with others. What if I miss a dose? If you miss a dose, refer to the patient information sheet you received with your medicine for direction. If you miss more than one pill, this medicine may not be as effective and you may need to use another form of birth control. What may interact with this medicine? Do not take this medicine with the following medication: -dasabuvir; ombitasvir; paritaprevir; ritonavir -ombitasvir; paritaprevir; ritonavir This medicine may also interact with the following medications: -acetaminophen -antibiotics or medicines for infections, especially rifampin, rifabutin, rifapentine, and griseofulvin, and possibly penicillins or tetracyclines -aprepitant -ascorbic acid (vitamin C) -atorvastatin -barbiturate medicines, such as phenobarbital -bosentan -carbamazepine -caffeine -clofibrate -cyclosporine -dantrolene -doxercalciferol -felbamate -grapefruit juice -hydrocortisone -medicines for anxiety or sleeping problems, such as diazepam or temazepam -medicines for diabetes, including pioglitazone -mineral oil -modafinil -mycophenolate -nefazodone -oxcarbazepine -phenytoin -prednisolone -ritonavir or other medicines for HIV infection or AIDS -rosuvastatin -selegiline -soy isoflavones supplements -St. John's wort -tamoxifen or raloxifene -theophylline -thyroid hormones -topiramate -warfarin This list may not describe all possible interactions. Give your health care  provider a list of all the medicines, herbs, non-prescription drugs, or dietary supplements you use. Also tell them if you smoke, drink alcohol, or use illegal drugs. Some   items may interact with your medicine. What should I watch for while using this medicine? Visit your doctor or health care professional for regular checks on your progress. You will need a regular breast and pelvic exam and Pap smear while on this medicine. Use an additional method of contraception during the first cycle that you take these tablets. If you have any reason to think you are pregnant, stop taking this medicine right away and contact your doctor or health care professional. If you are taking this medicine for hormone related problems, it may take several cycles of use to see improvement in your condition. Smoking increases the risk of getting a blood clot or having a stroke while you are taking birth control pills, especially if you are more than 39 years old. You are strongly advised not to smoke. This medicine can make your body retain fluid, making your fingers, hands, or ankles swell. Your blood pressure can go up. Contact your doctor or health care professional if you feel you are retaining fluid. This medicine can make you more sensitive to the sun. Keep out of the sun. If you cannot avoid being in the sun, wear protective clothing and use sunscreen. Do not use sun lamps or tanning beds/booths. If you wear contact lenses and notice visual changes, or if the lenses begin to feel uncomfortable, consult your eye care specialist. In some women, tenderness, swelling, or minor bleeding of the gums may occur. Notify your dentist if this happens. Brushing and flossing your teeth regularly may help limit this. See your dentist regularly and inform your dentist of the medicines you are taking. If you are going to have elective surgery, you may need to stop taking this medicine before the surgery. Consult your health care  professional for advice. This medicine does not protect you against HIV infection (AIDS) or any other sexually transmitted diseases. What side effects may I notice from receiving this medicine? Side effects that you should report to your doctor or health care professional as soon as possible: -allergic reactions like skin rash, itching or hives, swelling of the face, lips, or tongue -breast tissue changes or discharge -changes in vaginal bleeding during your period or between your periods -changes in vision -chest pain -confusion -coughing up blood -dizziness -feeling faint or lightheaded -headaches or migraines -leg, arm or groin pain -loss of balance or coordination -severe or sudden headaches -stomach pain (severe) -sudden shortness of breath -sudden numbness or weakness of the face, arm or leg -symptoms of vaginal infection like itching, irritation or unusual discharge -tenderness in the upper abdomen -trouble speaking or understanding -vomiting -yellowing of the eyes or skin Side effects that usually do not require medical attention (report to your doctor or health care professional if they continue or are bothersome): -breakthrough bleeding and spotting that continues beyond the 3 initial cycles of pills -breast tenderness -mood changes, anxiety, depression, frustration, anger, or emotional outbursts -increased sensitivity to sun or ultraviolet light -nausea -skin rash, acne, or brown spots on the skin -weight gain (slight) This list may not describe all possible side effects. Call your doctor for medical advice about side effects. You may report side effects to FDA at 1-800-FDA-1088. Where should I keep my medicine? Keep out of the reach of children. Store at room temperature between 15 and 30 degrees C (59 and 86 degrees F). Throw away any unused medicine after the expiration date. NOTE: This sheet is a summary. It may not cover all possible information. If you   have  questions about this medicine, talk to your doctor, pharmacist, or health care provider.  2018 Elsevier/Gold Standard (2016-08-06 08:04:41)  

## 2018-01-21 NOTE — Progress Notes (Signed)
37 year old SHF G1 P0 presents to discuss contraception. Had gastric sleeve done in [place] 2 weeks ago. Was instructed to use contraception for at least one year while healing and weight loss occurring. Same partner. Has used OCs in the past without problem. Nonsmoker. History of infertility, ectopic pregnancy, obesity. Regular monthly cycle cycle started today. Denies any concerns of vaginal discharge, urinary symptoms, abdominal pain, fever.  Exam: Appears well, obese.  Contraception management  Plan: Contraception options reviewed prefers a pill, Loestrin 1/20 prescription, proper use, slight risk for blood clots and strokes reviewed. Start up instructions reviewed instructed to start today, (cycle started today) condoms first month. Hoping to conceive next year. Multivitamin daily encouraged. Instructed to call if irregular cycles or problems.

## 2018-02-13 ENCOUNTER — Telehealth: Payer: Self-pay | Admitting: *Deleted

## 2018-02-13 NOTE — Telephone Encounter (Signed)
Pt informed

## 2018-02-13 NOTE — Telephone Encounter (Signed)
Tell her to double up today and tomarrow to see if that stops it and if it does skip placebo week and start new pack of pills.

## 2018-02-13 NOTE — Telephone Encounter (Signed)
Pt called to follow up from [place] on 01/21/18 started on Loestrin 1/20 taking daily on time, in 1st pack, she started pills when her cycle started had spotting, for couple days, but has had a flow daily since then, this week changing pad every 2-3 hours, states this pack doesn't have placebo week, has 3 pills left. I did relay not abnormal to have irregular bleeding in first 3 packs of pills, but this appears to be more irregular and I would run by you. Pt aware you are out of the office. Please advise

## 2018-02-13 NOTE — Telephone Encounter (Signed)
Left message for pt to call.

## 2018-02-17 ENCOUNTER — Telehealth: Payer: Self-pay | Admitting: *Deleted

## 2018-02-17 NOTE — Telephone Encounter (Signed)
Pt called to follow up from telephone encounter on 02/13/18 she doubled up on pills x 2 days and bleeding did not stop. Pt asked what to do next? Please advise

## 2018-02-18 ENCOUNTER — Other Ambulatory Visit: Payer: Self-pay | Admitting: Women's Health

## 2018-02-18 MED ORDER — MEDROXYPROGESTERONE ACETATE 10 MG PO TABS: 10.0000 mg | ORAL_TABLET | Freq: Every day | ORAL | 0 refills | Status: DC

## 2018-02-18 NOTE — Telephone Encounter (Signed)
TC today has already changed 3 times heavy bleeding like a period has now been bleeding like a cycle  for greater than 3 w Has used condoms consistently.  Instructed to check a home U PT if negative Provera 10 mg daily for 10 days. Instructed call if bleeding does not stop. 2013 had an endometrial polyp. Has lost greater than 30 pounds in the past month had weight loss surgery end of January.  Instructed to test start back on OCs first day of next cycle to take daily and continue condoms until then and first month back on OCs. Reviewed if continued problems with bleeding will proceed with a sonohysterogram with Dr. Phineas Real. Left message on her cell phone 02/17/2018.

## 2018-06-18 DIAGNOSIS — L309 Dermatitis, unspecified: Secondary | ICD-10-CM | POA: Diagnosis not present

## 2018-10-06 ENCOUNTER — Encounter: Payer: Self-pay | Admitting: Women's Health

## 2018-10-06 ENCOUNTER — Ambulatory Visit (INDEPENDENT_AMBULATORY_CARE_PROVIDER_SITE_OTHER): Payer: BLUE CROSS/BLUE SHIELD | Admitting: Women's Health

## 2018-10-06 VITALS — BP 120/78 | Ht 63.0 in | Wt 176.0 lb

## 2018-10-06 DIAGNOSIS — Z01419 Encounter for gynecological examination (general) (routine) without abnormal findings: Secondary | ICD-10-CM | POA: Diagnosis not present

## 2018-10-06 DIAGNOSIS — Z30011 Encounter for initial prescription of contraceptive pills: Secondary | ICD-10-CM

## 2018-10-06 DIAGNOSIS — Z23 Encounter for immunization: Secondary | ICD-10-CM | POA: Diagnosis not present

## 2018-10-06 MED ORDER — NORETHINDRONE ACET-ETHINYL EST 1-20 MG-MCG PO TABS: 1.0000 | ORAL_TABLET | Freq: Every day | ORAL | 4 refills | Status: DC

## 2018-10-06 NOTE — Progress Notes (Signed)
Becky Everett 39/23/1982 [id number]    History:    Presents for annual exam.  Monthly cycle on Loestrin.  Normal Pap history.  12/2017 gastric sleeve done in [place], weight is down 65 pounds.  02/2016 ruptured ectopic with LSO.  History of infertility and SAB.  Plans to start trying to conceive February 2020, 1 year after surgery.  Past medical history, past surgical history, family history and social history were all reviewed and documented in the EPIC chart.  Works for [employer], [employer].  History of a normal HSG.  Mother stroke, diabetes, father diabetic on dialysis.  ROS:  A ROS was performed and pertinent positives and negatives are included.  Exam:  Vitals:   10/06/18 1405  BP: 120/78  Weight: 176 lb (79.8 kg)  Height: 5\' 3"  (1.6 m)   Body mass index is 31.18 kg/m.   General appearance:  Normal Thyroid:  Symmetrical, normal in size, without palpable masses or nodularity. Respiratory  Auscultation:  Clear without wheezing or rhonchi Cardiovascular  Auscultation:  Regular rate, without rubs, murmurs or gallops  Edema/varicosities:  Not grossly evident Abdominal  Soft,nontender, without masses, guarding or rebound.  Liver/spleen:  No organomegaly noted  Hernia:  None appreciated  Skin  Inspection:  Grossly normal   Breasts: Examined lying and sitting.     Right: Without masses, retractions, discharge or axillary adenopathy.     Left: Without masses, retractions, discharge or axillary adenopathy. Gentitourinary   Inguinal/mons:  Normal without inguinal adenopathy  External genitalia:  Normal  BUS/Urethra/Skene's glands:  Normal  Vagina:  Normal  Cervix:  Normal  Uterus:   normal in size, shape and contour.  Midline and mobile  Adnexa/parametria:     Rt: Without masses or tenderness.   Lt: Without masses or tenderness.  Anus and perineum: Normal  Digital rectal exam: Normal sphincter tone without palpated masses or tenderness  Assessment/Plan:  37 y.o. M HF G2, P0  for annual exam with no complaints.    Monthly cycle on Loestrin 12/2017 gastric sleeve  Plan: Congratulations given on weight loss will continue healthy diet , is going to try to conceive February 2020 at the one-year mark after gastric sleeve surgery.  Instructed to call if no conception after 3 to 4 months of trying will schedule fertility management.  Reviewed importance of increasing frequency of intercourse.  Return to office with missed cycle for viability/dating ultrasound.  Safe pregnancy behaviors reviewed.  Aware we no longer deliver.  Loestrin 1/20 prescription, proper use given slight risk for blood clots and strokes reviewed will continue until January.  SBE's, exercise, calcium rich foods,  prenatal vitamin daily encouraged.  CBC, glucose, rubella titer.  Pap normal 2018, new screening guidelines reviewed.    Huel Cote [facility], 2:09 PM 10/06/2018

## 2018-10-06 NOTE — Patient Instructions (Signed)

## 2018-10-07 LAB — CBC WITH DIFFERENTIAL/PLATELET
Basophils Absolute: 53 cells/uL (ref 0–200)
Basophils Relative: 0.6 %
Eosinophils Absolute: 79 cells/uL (ref 15–500)
Eosinophils Relative: 0.9 %
HEMATOCRIT: 29.7 % — AB (ref 35.0–45.0)
Hemoglobin: 10.4 g/dL — ABNORMAL LOW (ref 11.7–15.5)
LYMPHS ABS: 3212 {cells}/uL (ref 850–3900)
MCH: 28.5 pg (ref 27.0–33.0)
MCHC: 35 g/dL (ref 32.0–36.0)
MCV: 81.4 fL (ref 80.0–100.0)
MPV: 12.1 fL (ref 7.5–12.5)
Monocytes Relative: 3.7 %
NEUTROS ABS: 5130 {cells}/uL (ref 1500–7800)
NEUTROS PCT: 58.3 %
Platelets: 408 10*3/uL — ABNORMAL HIGH (ref 140–400)
RBC: 3.65 10*6/uL — AB (ref 3.80–5.10)
RDW: 14 % (ref 11.0–15.0)
Total Lymphocyte: 36.5 %
WBC: 8.8 10*3/uL (ref 3.8–10.8)
WBCMIX: 326 {cells}/uL (ref 200–950)

## 2018-10-07 LAB — GLUCOSE, RANDOM: Glucose, Bld: 99 mg/dL (ref 65–99)

## 2018-10-07 LAB — RUBELLA SCREEN: Rubella: 12.1 index

## 2018-11-19 DIAGNOSIS — J309 Allergic rhinitis, unspecified: Secondary | ICD-10-CM | POA: Diagnosis not present

## 2018-11-19 DIAGNOSIS — E7800 Pure hypercholesterolemia, unspecified: Secondary | ICD-10-CM | POA: Diagnosis not present

## 2018-11-19 DIAGNOSIS — Z0000 Encounter for general adult medical examination without abnormal findings: Secondary | ICD-10-CM | POA: Diagnosis not present

## 2019-07-13 DIAGNOSIS — R0981 Nasal congestion: Secondary | ICD-10-CM | POA: Diagnosis not present

## 2019-09-03 ENCOUNTER — Other Ambulatory Visit: Payer: Self-pay

## 2019-09-03 DIAGNOSIS — Z20822 Contact with and (suspected) exposure to covid-19: Secondary | ICD-10-CM

## 2019-09-04 LAB — NOVEL CORONAVIRUS, NAA: SARS-CoV-2, NAA: NOT DETECTED

## 2019-10-09 ENCOUNTER — Other Ambulatory Visit: Payer: Self-pay

## 2019-10-12 ENCOUNTER — Encounter: Payer: Self-pay | Admitting: Women's Health

## 2019-10-12 ENCOUNTER — Other Ambulatory Visit: Payer: Self-pay

## 2019-10-12 ENCOUNTER — Ambulatory Visit (INDEPENDENT_AMBULATORY_CARE_PROVIDER_SITE_OTHER): Payer: BC Managed Care – PPO | Admitting: Women's Health

## 2019-10-12 VITALS — BP 110/78 | Ht 63.0 in | Wt 182.0 lb

## 2019-10-12 DIAGNOSIS — Z833 Family history of diabetes mellitus: Secondary | ICD-10-CM | POA: Diagnosis not present

## 2019-10-12 DIAGNOSIS — Z01419 Encounter for gynecological examination (general) (routine) without abnormal findings: Secondary | ICD-10-CM

## 2019-10-12 DIAGNOSIS — Z23 Encounter for immunization: Secondary | ICD-10-CM

## 2019-10-12 DIAGNOSIS — Z131 Encounter for screening for diabetes mellitus: Secondary | ICD-10-CM | POA: Diagnosis not present

## 2019-10-12 NOTE — Progress Notes (Signed)
Becky Everett 1981-01-01 [id number]    History:    Presents for annual exam.  Monthly cycle using no contraception pregnancy desired.  Normal Pap history.  2017 ruptured ectopic with LSO with a negative HSG after.  12/2017 gastric sleeve in [place] down 65 pounds and doing well.  Stopped OCs January 2020 but has been gone to [place], father ill and then passed away and started actively trying to conceive in August 2020.  Past medical history, past surgical history, family history and social history were all reviewed and documented in the EPIC chart.  Works in a Social worker.  Parents diabetes, mother stroke living, father deceased from heart disease at 9.  ROS:  A ROS was performed and pertinent positives and negatives are included.  Exam:  Vitals:   10/12/19 1357  BP: 110/78  Weight: 182 lb (82.6 kg)  Height: 5\' 3"  (1.6 m)   Body mass index is 32.24 kg/m.   General appearance:  Normal Thyroid:  Symmetrical, normal in size, without palpable masses or nodularity. Respiratory  Auscultation:  Clear without wheezing or rhonchi Cardiovascular  Auscultation:  Regular rate, without rubs, murmurs or gallops  Edema/varicosities:  Not grossly evident Abdominal  Soft,nontender, without masses, guarding or rebound.  Liver/spleen:  No organomegaly noted  Hernia:  None appreciated  Skin  Inspection:  Grossly normal   Breasts: Examined lying and sitting.     Right: Without masses, retractions, discharge or axillary adenopathy.     Left: Without masses, retractions, discharge or axillary adenopathy. Gentitourinary   Inguinal/mons:  Normal without inguinal adenopathy  External genitalia:  Normal  BUS/Urethra/Skene's glands:  Normal  Vagina:  Normal  Cervix:  Normal  Uterus:  normal in size, shape and contour.  Midline and mobile  Adnexa/parametria:     Rt: Without masses or tenderness.   Lt: Without masses or tenderness.  Anus and perineum: Normal  Digital rectal  exam: Normal sphincter tone without palpated masses or tenderness  Assessment/Plan:  38 y.o. M HF G2 P0 for annual exam with no complaints.  Monthly cycles/desires conception History of anemia Obesity, 2019 gastric sleeve  Plan: Reviewed importance of increasing frequency of intercourse, especially day 7 through 21 of each cycle, continue prenatal vitamin daily, return to office with missed cycle.  Reviewed if not pregnant by January to call the office will refer for fertility management.  SBEs, annual screening mammogram, breast center information given instructed to schedule with menstrual cycle so she knows she is not pregnant.. Continue to work on increasing exercise and decreasing calories/carbs, has gained 6 pounds from last year.  CBC, glucose, Pap normal 2018, new screening guidelines reviewed.    Huel Cote [facility], 2:16 PM 10/12/2019

## 2019-10-12 NOTE — Patient Instructions (Signed)
Breast center [phone]  Health Maintenance, Female Adopting a healthy lifestyle and getting preventive care are important in promoting health and wellness. Ask your health care provider about:  The right schedule for you to have regular tests and exams.  Things you can do on your own to prevent diseases and keep yourself healthy. What should I know about diet, weight, and exercise? Eat a healthy diet   Eat a diet that includes plenty of vegetables, fruits, low-fat dairy products, and lean protein.  Do not eat a lot of foods that are high in solid fats, added sugars, or sodium. Maintain a healthy weight Body mass index (BMI) is used to identify weight problems. It estimates body fat based on height and weight. Your health care provider can help determine your BMI and help you achieve or maintain a healthy weight. Get regular exercise Get regular exercise. This is one of the most important things you can do for your health. Most adults should:  Exercise for at least 150 minutes each week. The exercise should increase your heart rate and make you sweat (moderate-intensity exercise).  Do strengthening exercises at least twice a week. This is in addition to the moderate-intensity exercise.  Spend less time sitting. Even light physical activity can be beneficial. Watch cholesterol and blood lipids Have your blood tested for lipids and cholesterol at 40 years of age, then have this test every 5 years. Have your cholesterol levels checked more often if:  Your lipid or cholesterol levels are high.  You are older than 40 years of age.  You are at high risk for heart disease. What should I know about cancer screening? Depending on your health history and family history, you may need to have cancer screening at various ages. This may include screening for:  Breast cancer.  Cervical cancer.  Colorectal cancer.  Skin cancer.  Lung cancer. What should I know about heart disease,  diabetes, and high blood pressure? Blood pressure and heart disease  High blood pressure causes heart disease and increases the risk of stroke. This is more likely to develop in people who have high blood pressure readings, are of African descent, or are overweight.  Have your blood pressure checked: ? Every 3-5 years if you are 18-39 years of age. ? Every year if you are 40 years old or older. Diabetes Have regular diabetes screenings. This checks your fasting blood sugar level. Have the screening done:  Once every three years after age 40 if you are at a normal weight and have a low risk for diabetes.  More often and at a younger age if you are overweight or have a high risk for diabetes. What should I know about preventing infection? Hepatitis B If you have a higher risk for hepatitis B, you should be screened for this virus. Talk with your health care provider to find out if you are at risk for hepatitis B infection. Hepatitis C Testing is recommended for:  Everyone born from 1945 through 1965.  Anyone with known risk factors for hepatitis C. Sexually transmitted infections (STIs)  Get screened for STIs, including gonorrhea and chlamydia, if: ? You are sexually active and are younger than 40 years of age. ? You are older than 40 years of age and your health care provider tells you that you are at risk for this type of infection. ? Your sexual activity has changed since you were last screened, and you are at increased risk for chlamydia or gonorrhea. Ask your   your health care provider if you are at risk.  Ask your health care provider about whether you are at high risk for HIV. Your health care provider may recommend a prescription medicine to help prevent HIV infection. If you choose to take medicine to prevent HIV, you should first get tested for HIV. You should then be tested every 3 months for as long as you are taking the medicine. Pregnancy  If you are about to stop having your  period (premenopausal) and you may become pregnant, seek counseling before you get pregnant.  Take 400 to 800 micrograms (mcg) of folic acid every day if you become pregnant.  Ask for birth control (contraception) if you want to prevent pregnancy. Osteoporosis and menopause Osteoporosis is a disease in which the bones lose minerals and strength with aging. This can result in bone fractures. If you are 65 years old or older, or if you are at risk for osteoporosis and fractures, ask your health care provider if you should:  Be screened for bone loss.  Take a calcium or vitamin D supplement to lower your risk of fractures.  Be given hormone replacement therapy (HRT) to treat symptoms of menopause. Follow these instructions at home: Lifestyle  Do not use any products that contain nicotine or tobacco, such as cigarettes, e-cigarettes, and chewing tobacco. If you need help quitting, ask your health care provider.  Do not use street drugs.  Do not share needles.  Ask your health care provider for help if you need support or information about quitting drugs. Alcohol use  Do not drink alcohol if: ? Your health care provider tells you not to drink. ? You are pregnant, may be pregnant, or are planning to become pregnant.  If you drink alcohol: ? Limit how much you use to 0-1 drink a day. ? Limit intake if you are breastfeeding.  Be aware of how much alcohol is in your drink. In the [place] one drink equals one 12 oz bottle of beer (355 mL), one 5 oz glass of wine (148 mL), or one 1 oz glass of hard liquor (44 mL). General instructions  Schedule regular health, dental, and eye exams.  Stay current with your vaccines.  Tell your health care provider if: ? You often feel depressed. ? You have ever been abused or do not feel safe at home. Summary  Adopting a healthy lifestyle and getting preventive care are important in promoting health and wellness.  Follow your health care provider's  instructions about healthy diet, exercising, and getting tested or screened for diseases.  Follow your health care provider's instructions on monitoring your cholesterol and blood pressure. This information is not intended to replace advice given to you by your health care provider. Make sure you discuss any questions you have with your health care provider. Document Released: 06/11/2011 Document Revised: 11/19/2018 Document Reviewed: 11/19/2018 Elsevier Patient Education  2020 Elsevier Inc.  

## 2019-10-13 LAB — CBC WITH DIFFERENTIAL/PLATELET
Absolute Monocytes: 485 cells/uL (ref 200–950)
Basophils Absolute: 43 cells/uL (ref 0–200)
Basophils Relative: 0.5 %
Eosinophils Absolute: 43 cells/uL (ref 15–500)
Eosinophils Relative: 0.5 %
HCT: 32.1 % — ABNORMAL LOW (ref 35.0–45.0)
Hemoglobin: 10.6 g/dL — ABNORMAL LOW (ref 11.7–15.5)
Lymphs Abs: 2899 cells/uL (ref 850–3900)
MCH: 26.8 pg — ABNORMAL LOW (ref 27.0–33.0)
MCHC: 33 g/dL (ref 32.0–36.0)
MCV: 81.3 fL (ref 80.0–100.0)
MPV: 11.9 fL (ref 7.5–12.5)
Monocytes Relative: 5.7 %
Neutro Abs: 5032 cells/uL (ref 1500–7800)
Neutrophils Relative %: 59.2 %
Platelets: 361 10*3/uL (ref 140–400)
RBC: 3.95 10*6/uL (ref 3.80–5.10)
RDW: 14 % (ref 11.0–15.0)
Total Lymphocyte: 34.1 %
WBC: 8.5 10*3/uL (ref 3.8–10.8)

## 2019-10-13 LAB — GLUCOSE, RANDOM: Glucose, Bld: 79 mg/dL (ref 65–99)

## 2019-10-22 ENCOUNTER — Other Ambulatory Visit: Payer: Self-pay | Admitting: Women's Health

## 2019-10-22 DIAGNOSIS — Z1231 Encounter for screening mammogram for malignant neoplasm of breast: Secondary | ICD-10-CM

## 2019-11-17 ENCOUNTER — Emergency Department (HOSPITAL_COMMUNITY)
Admission: EM | Admit: 2019-11-17 | Discharge: 2019-11-17 | Disposition: A | Discharge status: Home / Self Care | Payer: BC Managed Care – PPO | Attending: Emergency Medicine | Admitting: Emergency Medicine

## 2019-11-17 ENCOUNTER — Emergency Department (HOSPITAL_COMMUNITY): Payer: BC Managed Care – PPO

## 2019-11-17 ENCOUNTER — Other Ambulatory Visit: Payer: Self-pay

## 2019-11-17 DIAGNOSIS — M79603 Pain in arm, unspecified: Secondary | ICD-10-CM | POA: Diagnosis not present

## 2019-11-17 DIAGNOSIS — S4992XA Unspecified injury of left shoulder and upper arm, initial encounter: Secondary | ICD-10-CM | POA: Diagnosis not present

## 2019-11-17 DIAGNOSIS — Z87891 Personal history of nicotine dependence: Secondary | ICD-10-CM | POA: Diagnosis not present

## 2019-11-17 DIAGNOSIS — I10 Essential (primary) hypertension: Secondary | ICD-10-CM | POA: Diagnosis not present

## 2019-11-17 DIAGNOSIS — R519 Headache, unspecified: Secondary | ICD-10-CM | POA: Diagnosis not present

## 2019-11-17 DIAGNOSIS — M25512 Pain in left shoulder: Secondary | ICD-10-CM | POA: Insufficient documentation

## 2019-11-17 DIAGNOSIS — R52 Pain, unspecified: Secondary | ICD-10-CM | POA: Diagnosis not present

## 2019-11-17 MED ORDER — CYCLOBENZAPRINE HCL 10 MG PO TABS: 10.0000 mg | ORAL_TABLET | Freq: Two times a day (BID) | ORAL | 0 refills | Status: DC | PRN

## 2019-11-17 NOTE — ED Notes (Signed)
[employer] at bedside 

## 2019-11-17 NOTE — ED Notes (Signed)
Pt verbalizes understanding of DC instructions. Pt belongings returned and is ambulatory out of ED.  
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
## 2019-11-17 NOTE — Discharge Instructions (Addendum)
Please read instructions below. Apply ice to your areas of pain for 20 minutes at a time. You can take 600 mg of Advil/ibuprofen every 6 hours as needed for pain. You can take flexeril every 12 hours as needed for muscle spasm. Be aware this medication can make you drowsy. Schedule an appointment with your primary care provider to follow up on your visit today. Return to the ER for severely worsening headache, vision changes, if new numbness or tingling in your arms or legs, inability to urinate, inability to hold your bowels, or weakness in your extremities.
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
## 2019-12-16 ENCOUNTER — Ambulatory Visit: Payer: Self-pay

## 2020-01-21 ENCOUNTER — Other Ambulatory Visit: Payer: Self-pay

## 2020-01-21 ENCOUNTER — Ambulatory Visit
Admission: RE | Admit: 2020-01-21 | Discharge: 2020-01-21 | Disposition: A | Discharge status: Home / Self Care | Payer: BC Managed Care – PPO | Source: Ambulatory Visit | Attending: Women's Health | Admitting: Women's Health

## 2020-01-21 DIAGNOSIS — Z1231 Encounter for screening mammogram for malignant neoplasm of breast: Secondary | ICD-10-CM | POA: Diagnosis not present

## 2020-02-04 DIAGNOSIS — R3915 Urgency of urination: Secondary | ICD-10-CM | POA: Diagnosis not present

## 2020-02-04 DIAGNOSIS — R350 Frequency of micturition: Secondary | ICD-10-CM | POA: Diagnosis not present

## 2020-02-04 DIAGNOSIS — Z23 Encounter for immunization: Secondary | ICD-10-CM | POA: Diagnosis not present

## 2020-02-12 DIAGNOSIS — J3489 Other specified disorders of nose and nasal sinuses: Secondary | ICD-10-CM | POA: Diagnosis not present

## 2020-03-04 DIAGNOSIS — Z23 Encounter for immunization: Secondary | ICD-10-CM | POA: Diagnosis not present

## 2020-05-02 DIAGNOSIS — M79642 Pain in left hand: Secondary | ICD-10-CM | POA: Diagnosis not present

## 2020-05-02 DIAGNOSIS — Z0000 Encounter for general adult medical examination without abnormal findings: Secondary | ICD-10-CM | POA: Diagnosis not present

## 2020-05-02 DIAGNOSIS — E7800 Pure hypercholesterolemia, unspecified: Secondary | ICD-10-CM | POA: Diagnosis not present

## 2020-05-02 DIAGNOSIS — M79641 Pain in right hand: Secondary | ICD-10-CM | POA: Diagnosis not present

## 2020-05-17 DIAGNOSIS — M79642 Pain in left hand: Secondary | ICD-10-CM | POA: Diagnosis not present

## 2020-05-17 DIAGNOSIS — G5603 Carpal tunnel syndrome, bilateral upper limbs: Secondary | ICD-10-CM | POA: Diagnosis not present

## 2020-05-17 DIAGNOSIS — M79641 Pain in right hand: Secondary | ICD-10-CM | POA: Diagnosis not present

## 2020-05-18 DIAGNOSIS — G5603 Carpal tunnel syndrome, bilateral upper limbs: Secondary | ICD-10-CM | POA: Insufficient documentation

## 2020-08-09 DIAGNOSIS — J302 Other seasonal allergic rhinitis: Secondary | ICD-10-CM

## 2020-08-09 DIAGNOSIS — H6983 Other specified disorders of Eustachian tube, bilateral: Secondary | ICD-10-CM | POA: Diagnosis not present

## 2020-08-09 DIAGNOSIS — H6993 Unspecified Eustachian tube disorder, bilateral: Secondary | ICD-10-CM | POA: Insufficient documentation

## 2020-08-09 DIAGNOSIS — H6121 Impacted cerumen, right ear: Secondary | ICD-10-CM | POA: Diagnosis not present

## 2020-08-09 HISTORY — DX: Other seasonal allergic rhinitis: J30.2

## 2020-09-14 DIAGNOSIS — H65193 Other acute nonsuppurative otitis media, bilateral: Secondary | ICD-10-CM | POA: Diagnosis not present

## 2020-09-19 DIAGNOSIS — H6983 Other specified disorders of Eustachian tube, bilateral: Secondary | ICD-10-CM | POA: Diagnosis not present

## 2020-09-19 DIAGNOSIS — H93293 Other abnormal auditory perceptions, bilateral: Secondary | ICD-10-CM | POA: Diagnosis not present

## 2020-09-19 DIAGNOSIS — H6121 Impacted cerumen, right ear: Secondary | ICD-10-CM | POA: Diagnosis not present

## 2020-09-19 DIAGNOSIS — J302 Other seasonal allergic rhinitis: Secondary | ICD-10-CM | POA: Diagnosis not present

## 2020-10-12 ENCOUNTER — Ambulatory Visit (INDEPENDENT_AMBULATORY_CARE_PROVIDER_SITE_OTHER): Payer: BC Managed Care – PPO | Admitting: Nurse Practitioner

## 2020-10-12 ENCOUNTER — Other Ambulatory Visit: Payer: Self-pay

## 2020-10-12 ENCOUNTER — Encounter: Payer: Self-pay | Admitting: Nurse Practitioner

## 2020-10-12 VITALS — BP 124/80 | Ht 63.0 in | Wt 189.0 lb

## 2020-10-12 DIAGNOSIS — Z23 Encounter for immunization: Secondary | ICD-10-CM

## 2020-10-12 DIAGNOSIS — Z01419 Encounter for gynecological examination (general) (routine) without abnormal findings: Secondary | ICD-10-CM | POA: Diagnosis not present

## 2020-10-12 DIAGNOSIS — N979 Female infertility, unspecified: Secondary | ICD-10-CM

## 2020-10-12 LAB — CBC WITH DIFFERENTIAL/PLATELET
Basophils Absolute: 50 cells/uL (ref 0–200)
Hemoglobin: 10.3 g/dL — ABNORMAL LOW (ref 11.7–15.5)
Lymphs Abs: 2894 cells/uL (ref 850–3900)
Platelets: 287 10*3/uL (ref 140–400)
WBC: 7.2 10*3/uL (ref 3.8–10.8)

## 2020-10-12 NOTE — Patient Instructions (Signed)
Health Maintenance, Female Adopting a healthy lifestyle and getting preventive care are important in promoting health and wellness. Ask your health care provider about:  The right schedule for you to have regular tests and exams.  Things you can do on your own to prevent diseases and keep yourself healthy. What should I know about diet, weight, and exercise? Eat a healthy diet   Eat a diet that includes plenty of vegetables, fruits, low-fat dairy products, and lean protein.  Do not eat a lot of foods that are high in solid fats, added sugars, or sodium. Maintain a healthy weight Body mass index (BMI) is used to identify weight problems. It estimates body fat based on height and weight. Your health care provider can help determine your BMI and help you achieve or maintain a healthy weight. Get regular exercise Get regular exercise. This is one of the most important things you can do for your health. Most adults should:  Exercise for at least 150 minutes each week. The exercise should increase your heart rate and make you sweat (moderate-intensity exercise).  Do strengthening exercises at least twice a week. This is in addition to the moderate-intensity exercise.  Spend less time sitting. Even light physical activity can be beneficial. Watch cholesterol and blood lipids Have your blood tested for lipids and cholesterol at 41 years of age, then have this test every 5 years. Have your cholesterol levels checked more often if:  Your lipid or cholesterol levels are high.  You are older than 40 years of age.  You are at high risk for heart disease. What should I know about cancer screening? Depending on your health history and family history, you may need to have cancer screening at various ages. This may include screening for:  Breast cancer.  Cervical cancer.  Colorectal cancer.  Skin cancer.  Lung cancer. What should I know about heart disease, diabetes, and high blood  pressure? Blood pressure and heart disease  High blood pressure causes heart disease and increases the risk of stroke. This is more likely to develop in people who have high blood pressure readings, are of African descent, or are overweight.  Have your blood pressure checked: ? Every 3-5 years if you are 18-39 years of age. ? Every year if you are 40 years old or older. Diabetes Have regular diabetes screenings. This checks your fasting blood sugar level. Have the screening done:  Once every three years after age 40 if you are at a normal weight and have a low risk for diabetes.  More often and at a younger age if you are overweight or have a high risk for diabetes. What should I know about preventing infection? Hepatitis B If you have a higher risk for hepatitis B, you should be screened for this virus. Talk with your health care provider to find out if you are at risk for hepatitis B infection. Hepatitis C Testing is recommended for:  Everyone born from 1945 through 1965.  Anyone with known risk factors for hepatitis C. Sexually transmitted infections (STIs)  Get screened for STIs, including gonorrhea and chlamydia, if: ? You are sexually active and are younger than 41 years of age. ? You are older than 41 years of age and your health care provider tells you that you are at risk for this type of infection. ? Your sexual activity has changed since you were last screened, and you are at increased risk for chlamydia or gonorrhea. Ask your health care provider if   you are at risk.  Ask your health care provider about whether you are at high risk for HIV. Your health care provider may recommend a prescription medicine to help prevent HIV infection. If you choose to take medicine to prevent HIV, you should first get tested for HIV. You should then be tested every 3 months for as long as you are taking the medicine. Pregnancy  If you are about to stop having your period (premenopausal) and  you may become pregnant, seek counseling before you get pregnant.  Take 400 to 800 micrograms (mcg) of folic acid every day if you become pregnant.  Ask for birth control (contraception) if you want to prevent pregnancy. Osteoporosis and menopause Osteoporosis is a disease in which the bones lose minerals and strength with aging. This can result in bone fractures. If you are 65 years old or older, or if you are at risk for osteoporosis and fractures, ask your health care provider if you should:  Be screened for bone loss.  Take a calcium or vitamin D supplement to lower your risk of fractures.  Be given hormone replacement therapy (HRT) to treat symptoms of menopause. Follow these instructions at home: Lifestyle  Do not use any products that contain nicotine or tobacco, such as cigarettes, e-cigarettes, and chewing tobacco. If you need help quitting, ask your health care provider.  Do not use street drugs.  Do not share needles.  Ask your health care provider for help if you need support or information about quitting drugs. Alcohol use  Do not drink alcohol if: ? Your health care provider tells you not to drink. ? You are pregnant, may be pregnant, or are planning to become pregnant.  If you drink alcohol: ? Limit how much you use to 0-1 drink a day. ? Limit intake if you are breastfeeding.  Be aware of how much alcohol is in your drink. In the [place] one drink equals one 12 oz bottle of beer (355 mL), one 5 oz glass of wine (148 mL), or one 1 oz glass of hard liquor (44 mL). General instructions  Schedule regular health, dental, and eye exams.  Stay current with your vaccines.  Tell your health care provider if: ? You often feel depressed. ? You have ever been abused or do not feel safe at home. Summary  Adopting a healthy lifestyle and getting preventive care are important in promoting health and wellness.  Follow your health care provider's instructions about healthy  diet, exercising, and getting tested or screened for diseases.  Follow your health care provider's instructions on monitoring your cholesterol and blood pressure. This information is not intended to replace advice given to you by your health care provider. Make sure you discuss any questions you have with your health care provider. Document Revised: 11/19/2018 Document Reviewed: 11/19/2018 Elsevier Patient Education  2020 Elsevier Inc.  

## 2020-10-12 NOTE — Progress Notes (Signed)
   Becky Everett 13-Jan-1981 [id number]   History:  39 y.o. G2P0020 presents for annual exam. 2017 LSO for ruptured ectopic pregnancy. 2019 gastric sleeve. Normal pap and mammogram history. Having cycles monthly but they may be off by 1-2 weeks at times. Has been actively trying to conceive for over a year. She would like a referral to a fertility specialist.   Gynecologic History Patient's last menstrual period was 09/25/2020. Period Pattern: (!) Irregular Menstrual Flow: Moderate Menstrual Control: Maxi pad, Tampon Dysmenorrhea: (!) Mild Dysmenorrhea Symptoms: Cramping Contraception: none Last Pap: 10/02/2017. Results were: normal Last mammogram: 01/24/2020. Results were: normal  Past medical history, past surgical history, family history and social history were all reviewed and documented in the EPIC chart.  ROS:  A ROS was performed and pertinent positives and negatives are included.  Exam:  Vitals:   10/12/20 1129  BP: 124/80  Weight: 189 lb (85.7 kg)  Height: 5\' 3"  (1.6 m)   Body mass index is 33.48 kg/m.  General appearance:  Normal Thyroid:  Symmetrical, normal in size, without palpable masses or nodularity. Respiratory  Auscultation:  Clear without wheezing or rhonchi Cardiovascular  Auscultation:  Regular rate, without rubs, murmurs or gallops  Edema/varicosities:  Not grossly evident Abdominal  Soft,nontender, without masses, guarding or rebound.  Liver/spleen:  No organomegaly noted  Hernia:  None appreciated  Skin  Inspection:  Grossly normal   Breasts: Examined lying and sitting.   Right: Without masses, retractions, discharge or axillary adenopathy.   Left: Without masses, retractions, discharge or axillary adenopathy. Gentitourinary   Inguinal/mons:  Normal without inguinal adenopathy  External genitalia:  Normal  BUS/Urethra/Skene's glands:  Normal  Vagina:  Normal  Cervix:  Normal  Uterus:  Normal in size, shape and contour.  Midline and  mobile  Adnexa/parametria:     Rt: Without masses or tenderness.   Lt: Without masses or tenderness.  Anus and perineum: Normal  Assessment/Plan:  39 y.o. G2P0020 for annual exam.   Well female exam with routine gynecological exam - Plan: CBC with Differential/Platelet, Comprehensive metabolic panel, Lipid panel. Education provided on SBEs, importance of preventative screenings, current guidelines, high calcium diet, regular exercise, and multivitamin daily.   Inability to conceive, female - Has been trying to conceive for over 1 year. History of ectopic pregnancy and miscarriage. Due to her age and history of miscarriage and ectopic pregnancy we will refer to fertility specialist.   Screening for cervical cancer - Normal pap history. Will repeat at 5-year interval per guidelines.   Screening for breast cancer - Normal mammogram history. Continue annual screenings. Normal breast exam today.   Follow up in 1 year for annual.     [name] [facility], 11:45 AM 10/12/2020

## 2020-10-13 ENCOUNTER — Telehealth: Payer: Self-pay | Admitting: *Deleted

## 2020-10-13 LAB — CBC WITH DIFFERENTIAL/PLATELET
Absolute Monocytes: 338 cells/uL (ref 200–950)
Basophils Relative: 0.7 %
Eosinophils Absolute: 72 cells/uL (ref 15–500)
Eosinophils Relative: 1 %
HCT: 31 % — ABNORMAL LOW (ref 35.0–45.0)
MCH: 26.3 pg — ABNORMAL LOW (ref 27.0–33.0)
MCHC: 33.2 g/dL (ref 32.0–36.0)
MCV: 79.1 fL — ABNORMAL LOW (ref 80.0–100.0)
MPV: 12.3 fL (ref 7.5–12.5)
Monocytes Relative: 4.7 %
Neutro Abs: 3845 cells/uL (ref 1500–7800)
Neutrophils Relative %: 53.4 %
RBC: 3.92 10*6/uL (ref 3.80–5.10)
RDW: 14.2 % (ref 11.0–15.0)
Total Lymphocyte: 40.2 %

## 2020-10-13 LAB — COMPREHENSIVE METABOLIC PANEL
AG Ratio: 1.7 (calc) (ref 1.0–2.5)
ALT: 13 U/L (ref 6–29)
AST: 15 U/L (ref 10–30)
Albumin: 4.4 g/dL (ref 3.6–5.1)
Alkaline phosphatase (APISO): 70 U/L (ref 31–125)
BUN: 14 mg/dL (ref 7–25)
CO2: 25 mmol/L (ref 20–32)
Calcium: 9.4 mg/dL (ref 8.6–10.2)
Chloride: 101 mmol/L (ref 98–110)
Creat: 0.63 mg/dL (ref 0.50–1.10)
Globulin: 2.6 g/dL (calc) (ref 1.9–3.7)
Glucose, Bld: 77 mg/dL (ref 65–99)
Potassium: 4.5 mmol/L (ref 3.5–5.3)
Sodium: 136 mmol/L (ref 135–146)
Total Bilirubin: 0.4 mg/dL (ref 0.2–1.2)
Total Protein: 7 g/dL (ref 6.1–8.1)

## 2020-10-13 LAB — LIPID PANEL
Cholesterol: 220 mg/dL — ABNORMAL HIGH (ref <200)
HDL: 49 mg/dL — ABNORMAL LOW (ref >=50)
LDL Cholesterol (Calc): 135 mg/dL (calc) — ABNORMAL HIGH
Non-HDL Cholesterol (Calc): 171 mg/dL (calc) — ABNORMAL HIGH (ref <130)
Total CHOL/HDL Ratio: 4.5 (calc) (ref <5.0)
Triglycerides: 223 mg/dL — ABNORMAL HIGH (ref <150)

## 2020-10-13 NOTE — Telephone Encounter (Signed)
-----   Message from [name], NP sent at 10/12/2020 11:53 AM EDT ----- Please send referral to fertility specialist. Thank you.

## 2020-10-13 NOTE — Telephone Encounter (Signed)
Referral sheet faxed to [facility] they will call to schedule. Patient aware as well.

## 2021-02-07 DIAGNOSIS — E669 Obesity, unspecified: Secondary | ICD-10-CM | POA: Diagnosis not present

## 2021-02-07 DIAGNOSIS — N979 Female infertility, unspecified: Secondary | ICD-10-CM | POA: Diagnosis not present

## 2021-02-07 DIAGNOSIS — Z319 Encounter for procreative management, unspecified: Secondary | ICD-10-CM | POA: Diagnosis not present

## 2021-04-14 DIAGNOSIS — R1013 Epigastric pain: Secondary | ICD-10-CM | POA: Diagnosis not present

## 2021-04-14 DIAGNOSIS — K5900 Constipation, unspecified: Secondary | ICD-10-CM | POA: Diagnosis not present

## 2021-04-14 DIAGNOSIS — Z9884 Bariatric surgery status: Secondary | ICD-10-CM | POA: Diagnosis not present

## 2021-04-14 DIAGNOSIS — E538 Deficiency of other specified B group vitamins: Secondary | ICD-10-CM | POA: Diagnosis not present

## 2021-04-14 DIAGNOSIS — D509 Iron deficiency anemia, unspecified: Secondary | ICD-10-CM | POA: Diagnosis not present

## 2021-05-09 ENCOUNTER — Other Ambulatory Visit: Payer: Self-pay | Admitting: Family Medicine

## 2021-05-09 DIAGNOSIS — E7800 Pure hypercholesterolemia, unspecified: Secondary | ICD-10-CM | POA: Diagnosis not present

## 2021-05-09 DIAGNOSIS — Z0000 Encounter for general adult medical examination without abnormal findings: Secondary | ICD-10-CM | POA: Diagnosis not present

## 2021-05-09 DIAGNOSIS — E538 Deficiency of other specified B group vitamins: Secondary | ICD-10-CM | POA: Diagnosis not present

## 2021-05-09 DIAGNOSIS — Z1231 Encounter for screening mammogram for malignant neoplasm of breast: Secondary | ICD-10-CM

## 2021-05-09 DIAGNOSIS — D509 Iron deficiency anemia, unspecified: Secondary | ICD-10-CM | POA: Diagnosis not present

## 2021-06-19 DIAGNOSIS — D509 Iron deficiency anemia, unspecified: Secondary | ICD-10-CM | POA: Diagnosis not present

## 2021-07-04 ENCOUNTER — Ambulatory Visit
Admission: RE | Admit: 2021-07-04 | Discharge: 2021-07-04 | Disposition: A | Discharge status: Home / Self Care | Payer: BC Managed Care – PPO | Source: Ambulatory Visit | Attending: Family Medicine | Admitting: Family Medicine

## 2021-07-04 ENCOUNTER — Other Ambulatory Visit: Payer: Self-pay

## 2021-07-04 DIAGNOSIS — Z1231 Encounter for screening mammogram for malignant neoplasm of breast: Secondary | ICD-10-CM | POA: Diagnosis not present

## 2021-07-31 DIAGNOSIS — L03011 Cellulitis of right finger: Secondary | ICD-10-CM | POA: Diagnosis not present

## 2021-08-29 IMAGING — MG DIGITAL SCREENING BILAT W/ TOMO W/ CAD
8 series · 8 of 24 positions shown · non-contrast
Comparison: None.

CLINICAL DATA: Screening.

EXAM:
DIGITAL SCREENING BILATERAL MAMMOGRAM WITH TOMO AND CAD

[R MLO synth-2D]
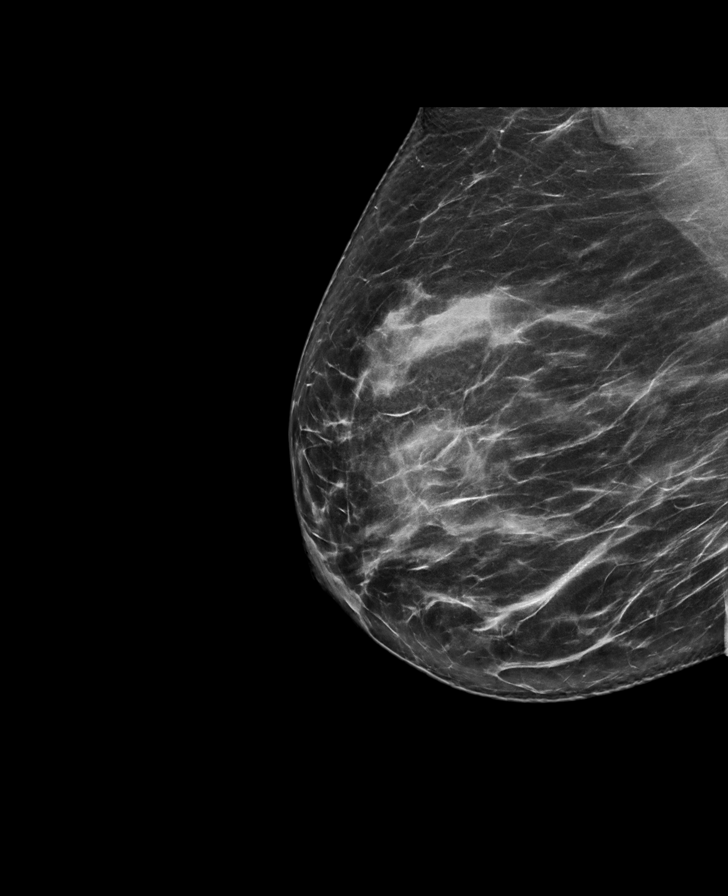

[L CC synth-2D]
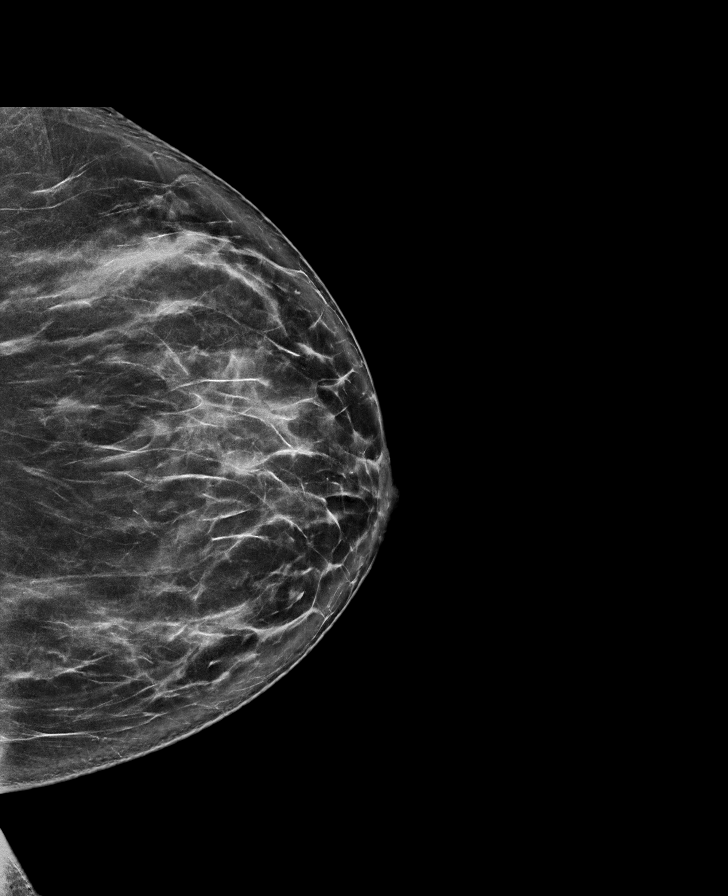

[R CC synth-2D]
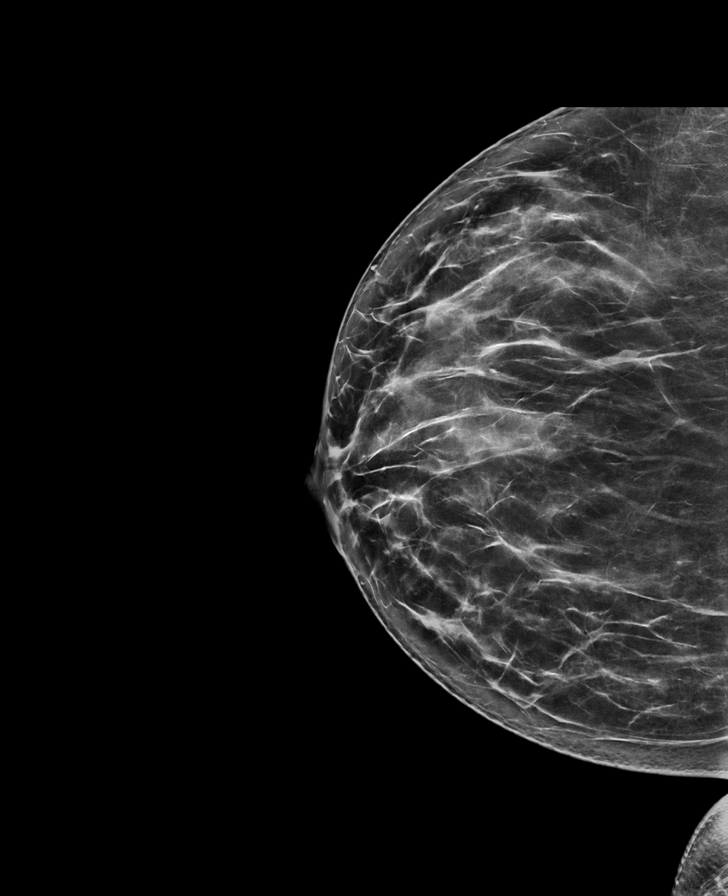

[L MLO synth-2D]
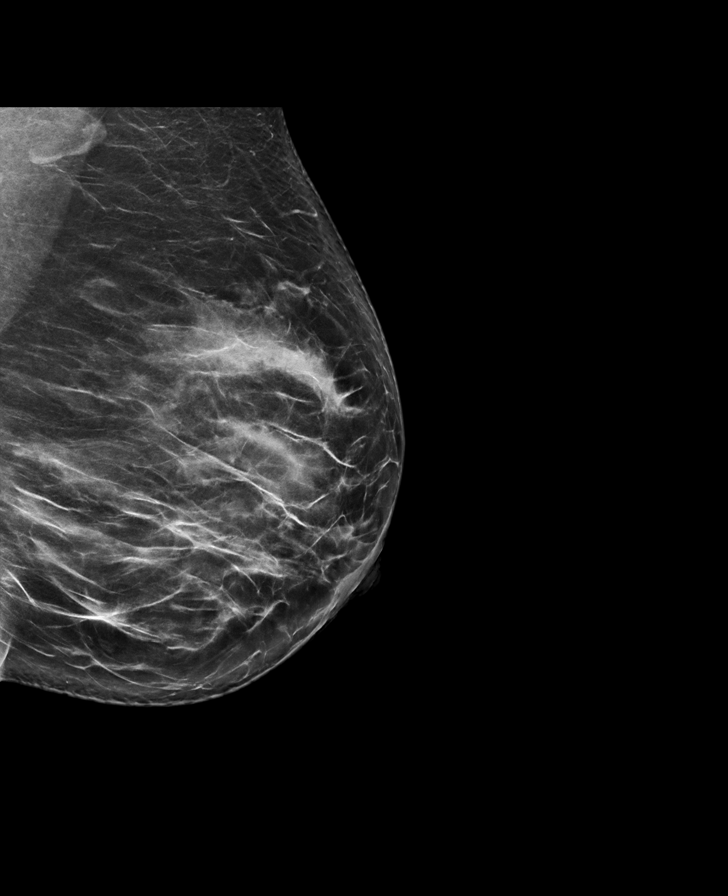

[L CC tomo · tomo slice 41/82.0]
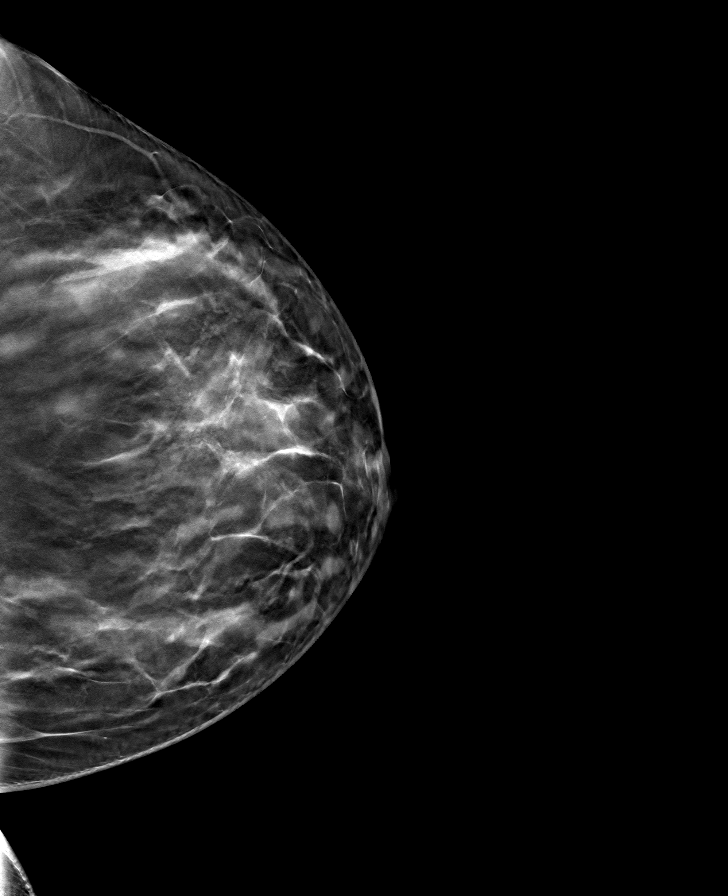

[R MLO tomo · tomo slice 43/85.0]
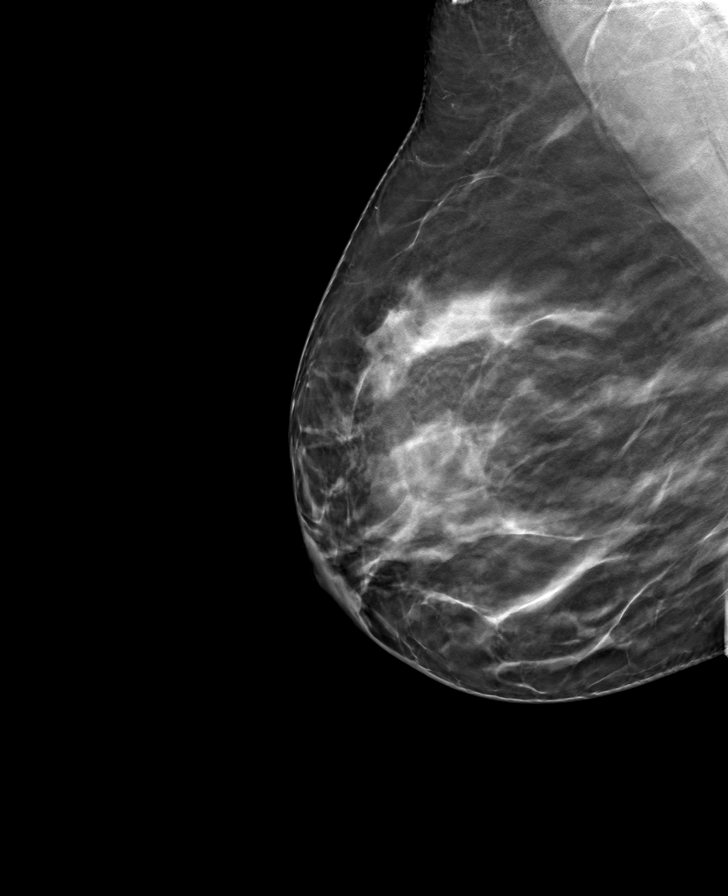

[L MLO tomo · tomo slice 45/88.0]
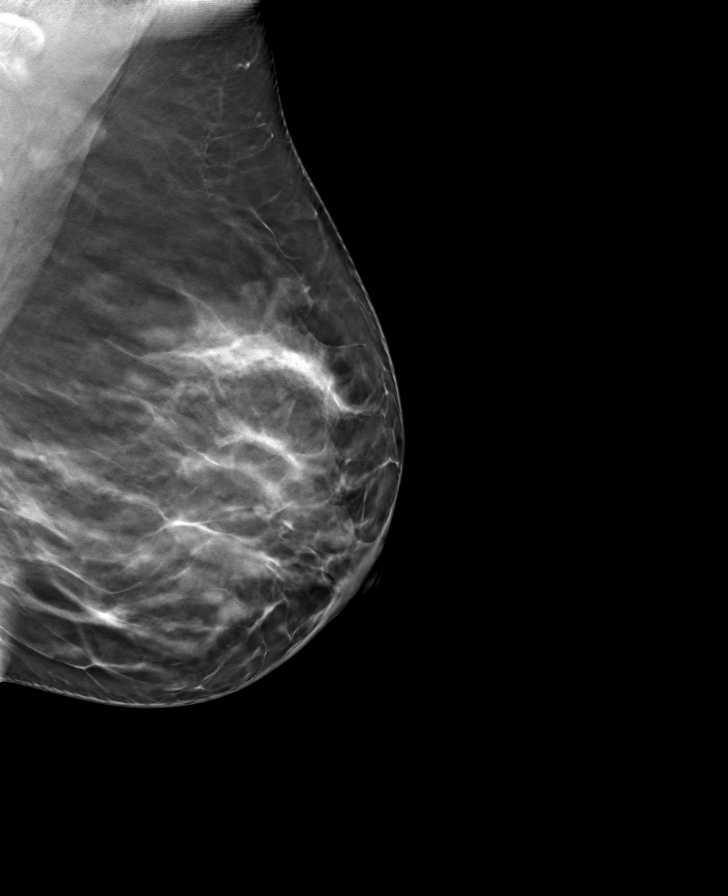

[R CC tomo · tomo slice 41/80.0]
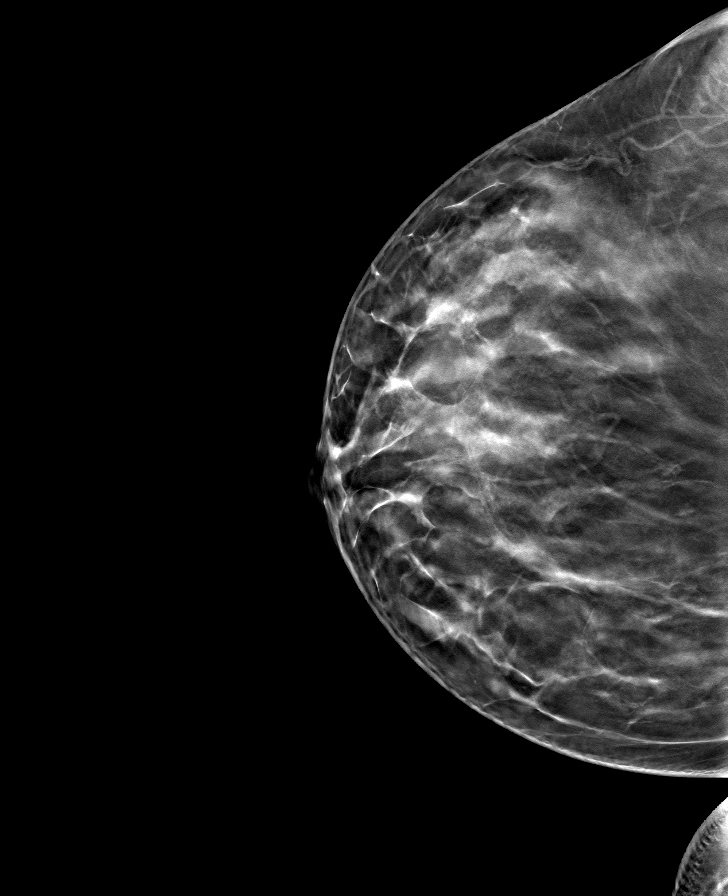

[8 of 24 positions shown; findings below may reference images not displayed]

ACR Breast Density Category c: The breast tissue is heterogeneously
dense, which may obscure small masses
FINDINGS: There are no findings suspicious for malignancy. Images were
processed with CAD.
IMPRESSION: No mammographic evidence of malignancy. A result letter of this
screening mammogram will be mailed directly to the patient.

RECOMMENDATION:
Screening mammogram in one year. (Code:EM-2-IHY)

BI-RADS CATEGORY  1: Negative.

## 2021-10-23 ENCOUNTER — Other Ambulatory Visit: Payer: Self-pay

## 2021-10-23 ENCOUNTER — Ambulatory Visit (INDEPENDENT_AMBULATORY_CARE_PROVIDER_SITE_OTHER): Payer: BC Managed Care – PPO | Admitting: Nurse Practitioner

## 2021-10-23 ENCOUNTER — Encounter: Payer: Self-pay | Admitting: Nurse Practitioner

## 2021-10-23 VITALS — BP 116/74 | Ht 62.0 in | Wt 180.0 lb

## 2021-10-23 DIAGNOSIS — Z23 Encounter for immunization: Secondary | ICD-10-CM

## 2021-10-23 DIAGNOSIS — E785 Hyperlipidemia, unspecified: Secondary | ICD-10-CM | POA: Diagnosis not present

## 2021-10-23 DIAGNOSIS — Z01419 Encounter for gynecological examination (general) (routine) without abnormal findings: Secondary | ICD-10-CM | POA: Diagnosis not present

## 2021-10-23 NOTE — Progress Notes (Signed)
   [place] 1981-12-06 [id number]   History:  40 y.o. G2P0020 presents for annual exam. Monthly cycles. 2017 LSO for ruptured ectopic pregnancy. Normal pap and mammogram history. Saw fertility specialist this year but has decided not to pursue at this time. 2019 gastric sleeve.   Gynecologic History Patient's last menstrual period was 10/02/2021. Period Cycle (Days): 28 Period Duration (Days): 7 Period Pattern: Regular Menstrual Flow: Moderate, Light Dysmenorrhea: (!) Severe Dysmenorrhea Symptoms: Cramping Contraception/Family planning: none Sexually active: Yes  Health Maintenance Last Pap: 10/02/2017. Results were: Normal, 5-year repeat Last mammogram: 07/04/2021. Results were: Normal Last colonoscopy: Not indicated Last Dexa: Not indicated  Past medical history, past surgical history, family history and social history were all reviewed and documented in the EPIC chart. Married. Works for pediatric dentist in billing.   ROS:  A ROS was performed and pertinent positives and negatives are included.  Exam:  Vitals:   10/23/21 0905  BP: 116/74  Weight: 180 lb (81.6 kg)  Height: 5\' 2"  (1.575 m)    Body mass index is 32.92 kg/m.  General appearance:  Normal Thyroid:  Symmetrical, normal in size, without palpable masses or nodularity. Respiratory  Auscultation:  Clear without wheezing or rhonchi Cardiovascular  Auscultation:  Regular rate, without rubs, murmurs or gallops  Edema/varicosities:  Not grossly evident Abdominal  Soft,nontender, without masses, guarding or rebound.  Liver/spleen:  No organomegaly noted  Hernia:  None appreciated  Skin  Inspection:  Grossly normal   Breasts: Examined lying and sitting.   Right: Without masses, retractions, discharge or axillary adenopathy.   Left: Without masses, retractions, discharge or axillary adenopathy. Genitourinary   Inguinal/mons:  Normal without inguinal adenopathy  External genitalia:  Normal appearing  vulva with no masses, tenderness, or lesions  BUS/Urethra/Skene's glands:  Normal  Vagina:  Normal appearing with normal color and discharge, no lesions  Cervix:  Normal appearing without discharge or lesions  Uterus:  Normal in size, shape and contour.  Midline and mobile, nontender  Adnexa/parametria:     Rt: Normal in size, without masses or tenderness.   Lt: Normal in size, without masses or tenderness.  Anus and perineum: Normal  Patient informed chaperone available to be present for breast and pelvic exam. Patient has requested no chaperone to be present. Patient has been advised what will be completed during breast and pelvic exam.   Assessment/Plan:  40 y.o. G2P0020 for annual exam.   Well female exam with routine gynecological exam - Plan: CBC with Differential/Platelet, Comprehensive metabolic panel. Education provided on SBEs, importance of preventative screenings, current guidelines, high calcium diet, regular exercise, and multivitamin daily.   Hyperlipidemia, unspecified hyperlipidemia type - Plan: Lipid panel  Need for immunization against influenza - Plan: Flu Vaccine QUAD 36mo+IM (Fluarix, Fluzone & Alfiuria Quad PF)  Screening for cervical cancer - Normal pap history. Will repeat at 5-year interval per guidelines.   Screening for breast cancer - Normal mammogram history. Continue annual screenings. Normal breast exam today.   Follow up in 1 year for annual.     [name] [facility], 9:27 AM 10/23/2021

## 2021-10-24 LAB — LIPID PANEL
Cholesterol: 207 mg/dL — ABNORMAL HIGH (ref <200)
HDL: 53 mg/dL (ref >=50)
LDL Cholesterol (Calc): 118 mg/dL (calc) — ABNORMAL HIGH
Non-HDL Cholesterol (Calc): 154 mg/dL (calc) — ABNORMAL HIGH (ref <130)
Total CHOL/HDL Ratio: 3.9 (calc) (ref <5.0)
Triglycerides: 239 mg/dL — ABNORMAL HIGH (ref <150)

## 2021-10-24 LAB — COMPREHENSIVE METABOLIC PANEL
AG Ratio: 1.5 (calc) (ref 1.0–2.5)
ALT: 13 U/L (ref 6–29)
AST: 14 U/L (ref 10–30)
Albumin: 4.3 g/dL (ref 3.6–5.1)
Alkaline phosphatase (APISO): 73 U/L (ref 31–125)
BUN: 10 mg/dL (ref 7–25)
CO2: 24 mmol/L (ref 20–32)
Calcium: 9.1 mg/dL (ref 8.6–10.2)
Chloride: 103 mmol/L (ref 98–110)
Creat: 0.67 mg/dL (ref 0.50–0.99)
Globulin: 2.8 g/dL (calc) (ref 1.9–3.7)
Glucose, Bld: 81 mg/dL (ref 65–99)
Potassium: 4.5 mmol/L (ref 3.5–5.3)
Sodium: 136 mmol/L (ref 135–146)
Total Bilirubin: 0.3 mg/dL (ref 0.2–1.2)
Total Protein: 7.1 g/dL (ref 6.1–8.1)

## 2021-10-24 LAB — CBC WITH DIFFERENTIAL/PLATELET
Absolute Monocytes: 359 cells/uL (ref 200–950)
Basophils Absolute: 47 cells/uL (ref 0–200)
Basophils Relative: 0.6 %
Eosinophils Absolute: 78 cells/uL (ref 15–500)
Eosinophils Relative: 1 %
HCT: 36.5 % (ref 35.0–45.0)
Hemoglobin: 11.7 g/dL (ref 11.7–15.5)
Lymphs Abs: 2660 cells/uL (ref 850–3900)
MCH: 27 pg (ref 27.0–33.0)
MCHC: 32.1 g/dL (ref 32.0–36.0)
MCV: 84.1 fL (ref 80.0–100.0)
MPV: 12.1 fL (ref 7.5–12.5)
Monocytes Relative: 4.6 %
Neutro Abs: 4657 cells/uL (ref 1500–7800)
Neutrophils Relative %: 59.7 %
Platelets: 344 10*3/uL (ref 140–400)
RBC: 4.34 10*6/uL (ref 3.80–5.10)
RDW: 13.7 % (ref 11.0–15.0)
Total Lymphocyte: 34.1 %
WBC: 7.8 10*3/uL (ref 3.8–10.8)

## 2021-11-07 DIAGNOSIS — J069 Acute upper respiratory infection, unspecified: Secondary | ICD-10-CM | POA: Diagnosis not present

## 2021-11-07 DIAGNOSIS — J029 Acute pharyngitis, unspecified: Secondary | ICD-10-CM | POA: Diagnosis not present

## 2021-11-07 DIAGNOSIS — Z03818 Encounter for observation for suspected exposure to other biological agents ruled out: Secondary | ICD-10-CM | POA: Diagnosis not present

## 2021-11-07 DIAGNOSIS — J209 Acute bronchitis, unspecified: Secondary | ICD-10-CM | POA: Diagnosis not present

## 2021-11-07 DIAGNOSIS — R519 Headache, unspecified: Secondary | ICD-10-CM | POA: Diagnosis not present

## 2021-12-13 DIAGNOSIS — R0981 Nasal congestion: Secondary | ICD-10-CM | POA: Diagnosis not present

## 2021-12-13 DIAGNOSIS — J029 Acute pharyngitis, unspecified: Secondary | ICD-10-CM | POA: Diagnosis not present

## 2021-12-27 DIAGNOSIS — R053 Chronic cough: Secondary | ICD-10-CM | POA: Diagnosis not present

## 2021-12-27 DIAGNOSIS — J3489 Other specified disorders of nose and nasal sinuses: Secondary | ICD-10-CM | POA: Diagnosis not present

## 2021-12-27 DIAGNOSIS — Z9884 Bariatric surgery status: Secondary | ICD-10-CM | POA: Diagnosis not present

## 2022-01-10 DIAGNOSIS — R350 Frequency of micturition: Secondary | ICD-10-CM | POA: Diagnosis not present

## 2022-05-28 DIAGNOSIS — E7800 Pure hypercholesterolemia, unspecified: Secondary | ICD-10-CM | POA: Diagnosis not present

## 2022-05-28 DIAGNOSIS — E538 Deficiency of other specified B group vitamins: Secondary | ICD-10-CM | POA: Diagnosis not present

## 2022-05-28 DIAGNOSIS — Z0000 Encounter for general adult medical examination without abnormal findings: Secondary | ICD-10-CM | POA: Diagnosis not present

## 2022-05-28 DIAGNOSIS — D509 Iron deficiency anemia, unspecified: Secondary | ICD-10-CM | POA: Diagnosis not present

## 2022-06-14 ENCOUNTER — Other Ambulatory Visit: Payer: Self-pay | Admitting: Nurse Practitioner

## 2022-06-14 DIAGNOSIS — Z1231 Encounter for screening mammogram for malignant neoplasm of breast: Secondary | ICD-10-CM

## 2022-07-06 ENCOUNTER — Ambulatory Visit: Payer: BC Managed Care – PPO

## 2022-07-10 ENCOUNTER — Ambulatory Visit
Admission: RE | Admit: 2022-07-10 | Discharge: 2022-07-10 | Disposition: A | Discharge status: Home / Self Care | Payer: BC Managed Care – PPO | Source: Ambulatory Visit | Attending: Nurse Practitioner | Admitting: Nurse Practitioner

## 2022-07-10 DIAGNOSIS — Z1231 Encounter for screening mammogram for malignant neoplasm of breast: Secondary | ICD-10-CM

## 2022-07-12 ENCOUNTER — Other Ambulatory Visit: Payer: Self-pay | Admitting: Nurse Practitioner

## 2022-07-12 DIAGNOSIS — R928 Other abnormal and inconclusive findings on diagnostic imaging of breast: Secondary | ICD-10-CM

## 2022-07-17 ENCOUNTER — Ambulatory Visit
Admission: RE | Admit: 2022-07-17 | Discharge: 2022-07-17 | Disposition: A | Discharge status: Home / Self Care | Payer: BC Managed Care – PPO | Source: Ambulatory Visit | Attending: Nurse Practitioner | Admitting: Nurse Practitioner

## 2022-07-17 DIAGNOSIS — N6489 Other specified disorders of breast: Secondary | ICD-10-CM | POA: Diagnosis not present

## 2022-07-17 DIAGNOSIS — R928 Other abnormal and inconclusive findings on diagnostic imaging of breast: Secondary | ICD-10-CM

## 2022-08-17 DIAGNOSIS — U071 COVID-19: Secondary | ICD-10-CM | POA: Diagnosis not present

## 2022-08-17 DIAGNOSIS — R0602 Shortness of breath: Secondary | ICD-10-CM | POA: Diagnosis not present

## 2022-10-23 NOTE — Progress Notes (Unsigned)
   Marathon 1981/04/29 [id number]   History:  41 y.o. G2P0020 presents for annual exam. Monthly cycles. S/P 2017 LSO for ruptured ectopic pregnancy. Normal pap and mammogram history. 2019 gastric sleeve.   Gynecologic History Patient's last menstrual period was 10/09/2022 (exact date). Period Duration (Days): 5-6 Period Pattern: Regular Menstrual Flow: Heavy, Light Menstrual Control: Maxi pad Menstrual Control Change Freq (Hours): 8 Dysmenorrhea: (!) Severe Dysmenorrhea Symptoms: Cramping, Headache Contraception/Family planning: none Sexually active: Yes  Health Maintenance Last Pap: 10/02/2017. Results were: Normal neg HPV Last mammogram: 07/10/2022. Results were: Left asymmetry, normal follow up imaging Last colonoscopy: Not indicated Last Dexa: Not indicated  Past medical history, past surgical history, family history and social history were all reviewed and documented in the EPIC chart. Married. Works for pediatric dentist in billing.   ROS:  A ROS was performed and pertinent positives and negatives are included.  Exam:  Vitals:   10/24/22 0832  BP: 110/68  Pulse: 96  Resp: 20  SpO2: 98%  Weight: 186 lb 9.6 oz (84.6 kg)  Height: 5' 2.6" (1.59 m)     Body mass index is 33.48 kg/m.  General appearance:  Normal Thyroid:  Symmetrical, normal in size, without palpable masses or nodularity. Respiratory  Auscultation:  Clear without wheezing or rhonchi Cardiovascular  Auscultation:  Regular rate, without rubs, murmurs or gallops  Edema/varicosities:  Not grossly evident Abdominal  Soft,nontender, without masses, guarding or rebound.  Liver/spleen:  No organomegaly noted  Hernia:  None appreciated  Skin  Inspection:  Grossly normal   Breasts: Examined lying and sitting.   Right: Without masses, retractions, discharge or axillary adenopathy.   Left: Without masses, retractions, discharge or axillary adenopathy. Genitourinary   Inguinal/mons:  Normal  without inguinal adenopathy  External genitalia:  Normal appearing vulva with no masses, tenderness, or lesions  BUS/Urethra/Skene's glands:  Normal  Vagina:  Normal appearing with normal color and discharge, no lesions  Cervix:  Normal appearing without discharge or lesions  Uterus:  Normal in size, shape and contour.  Midline and mobile, nontender  Adnexa/parametria:     Rt: Normal in size, without masses or tenderness.   Lt: Normal in size, without masses or tenderness.  Anus and perineum: Normal  Patient informed chaperone available to be present for breast and pelvic exam. Patient has requested no chaperone to be present. Patient has been advised what will be completed during breast and pelvic exam.   Assessment/Plan:  41 y.o. G2P0020 for annual exam.   Well female exam with routine gynecological exam - Plan: Cytology - PAP( [facility]), CBC with Differential/Platelet, Comprehensive metabolic panel. Education provided on SBEs, importance of preventative screenings, current guidelines, high calcium diet, regular exercise, and multivitamin daily.   Hyperlipidemia, unspecified hyperlipidemia type - Plan: Lipid panel  Need for immunization against influenza - Plan: Flu Vaccine QUAD 20moIM (Fluarix, Fluzone & Alfiuria Quad PF)  Screening for cervical cancer - Normal pap history. Pap today.   Screening for breast cancer - Normal mammogram history. Continue annual screenings. Normal breast exam today.   Follow up in 1 year for annual.     TTamela GammonWLafayette-Amg Specialty Hospital 10:37 AM 10/24/2022

## 2022-10-24 ENCOUNTER — Encounter: Payer: Self-pay | Admitting: Nurse Practitioner

## 2022-10-24 ENCOUNTER — Ambulatory Visit (INDEPENDENT_AMBULATORY_CARE_PROVIDER_SITE_OTHER): Payer: BC Managed Care – PPO | Admitting: Nurse Practitioner

## 2022-10-24 ENCOUNTER — Other Ambulatory Visit (HOSPITAL_COMMUNITY)
Admission: RE | Admit: 2022-10-24 | Discharge: 2022-10-24 | Disposition: A | Discharge status: Home / Self Care | Payer: BC Managed Care – PPO | Source: Ambulatory Visit | Attending: Nurse Practitioner | Admitting: Nurse Practitioner

## 2022-10-24 VITALS — BP 110/68 | HR 96 | Resp 20 | Ht 62.6 in | Wt 186.6 lb

## 2022-10-24 DIAGNOSIS — Z01419 Encounter for gynecological examination (general) (routine) without abnormal findings: Secondary | ICD-10-CM | POA: Diagnosis not present

## 2022-10-24 DIAGNOSIS — E785 Hyperlipidemia, unspecified: Secondary | ICD-10-CM | POA: Diagnosis not present

## 2022-10-24 DIAGNOSIS — Z23 Encounter for immunization: Secondary | ICD-10-CM

## 2022-10-24 LAB — COMPREHENSIVE METABOLIC PANEL
AG Ratio: 1.6 (calc) (ref 1.0–2.5)
ALT: 12 U/L (ref 6–29)
AST: 14 U/L (ref 10–30)
Albumin: 4.3 g/dL (ref 3.6–5.1)
Alkaline phosphatase (APISO): 71 U/L (ref 31–125)
BUN: 11 mg/dL (ref 7–25)
CO2: 27 mmol/L (ref 20–32)
Calcium: 9.4 mg/dL (ref 8.6–10.2)
Chloride: 106 mmol/L (ref 98–110)
Creat: 0.63 mg/dL (ref 0.50–0.99)
Globulin: 2.7 g/dL (calc) (ref 1.9–3.7)
Glucose, Bld: 83 mg/dL (ref 65–99)
Potassium: 5 mmol/L (ref 3.5–5.3)
Sodium: 139 mmol/L (ref 135–146)
Total Bilirubin: 0.3 mg/dL (ref 0.2–1.2)
Total Protein: 7 g/dL (ref 6.1–8.1)

## 2022-10-24 LAB — CBC WITH DIFFERENTIAL/PLATELET
Absolute Monocytes: 466 cells/uL (ref 200–950)
Basophils Absolute: 74 cells/uL (ref 0–200)
Basophils Relative: 1 %
Eosinophils Absolute: 74 cells/uL (ref 15–500)
Eosinophils Relative: 1 %
HCT: 33.3 % — ABNORMAL LOW (ref 35.0–45.0)
Hemoglobin: 10.8 g/dL — ABNORMAL LOW (ref 11.7–15.5)
Lymphs Abs: 2109 cells/uL (ref 850–3900)
MCH: 25.4 pg — ABNORMAL LOW (ref 27.0–33.0)
MCHC: 32.4 g/dL (ref 32.0–36.0)
MCV: 78.4 fL — ABNORMAL LOW (ref 80.0–100.0)
MPV: 11.3 fL (ref 7.5–12.5)
Monocytes Relative: 6.3 %
Neutro Abs: 4677 cells/uL (ref 1500–7800)
Neutrophils Relative %: 63.2 %
Platelets: 354 10*3/uL (ref 140–400)
RBC: 4.25 10*6/uL (ref 3.80–5.10)
RDW: 14 % (ref 11.0–15.0)
Total Lymphocyte: 28.5 %
WBC: 7.4 10*3/uL (ref 3.8–10.8)

## 2022-10-24 LAB — LIPID PANEL
Cholesterol: 207 mg/dL — ABNORMAL HIGH (ref <200)
HDL: 47 mg/dL — ABNORMAL LOW (ref >=50)
LDL Cholesterol (Calc): 118 mg/dL (calc) — ABNORMAL HIGH
Non-HDL Cholesterol (Calc): 160 mg/dL (calc) — ABNORMAL HIGH (ref <130)
Total CHOL/HDL Ratio: 4.4 (calc) (ref <5.0)
Triglycerides: 275 mg/dL — ABNORMAL HIGH (ref <150)

## 2022-10-26 LAB — CYTOLOGY - PAP
Comment: NEGATIVE
High risk HPV: NEGATIVE

## 2022-10-30 ENCOUNTER — Other Ambulatory Visit: Payer: Self-pay

## 2022-10-30 DIAGNOSIS — R87619 Unspecified abnormal cytological findings in specimens from cervix uteri: Secondary | ICD-10-CM

## 2022-11-12 ENCOUNTER — Other Ambulatory Visit (HOSPITAL_COMMUNITY)
Admission: RE | Admit: 2022-11-12 | Discharge: 2022-11-12 | Disposition: A | Discharge status: Home / Self Care | Payer: BC Managed Care – PPO | Source: Ambulatory Visit | Attending: Obstetrics and Gynecology | Admitting: Obstetrics and Gynecology

## 2022-11-12 ENCOUNTER — Encounter: Payer: Self-pay | Admitting: Obstetrics and Gynecology

## 2022-11-12 ENCOUNTER — Ambulatory Visit (INDEPENDENT_AMBULATORY_CARE_PROVIDER_SITE_OTHER): Payer: BC Managed Care – PPO | Admitting: Obstetrics and Gynecology

## 2022-11-12 VITALS — BP 122/64 | HR 72 | Wt 186.0 lb

## 2022-11-12 DIAGNOSIS — R87619 Unspecified abnormal cytological findings in specimens from cervix uteri: Secondary | ICD-10-CM | POA: Diagnosis not present

## 2022-11-12 DIAGNOSIS — Z01812 Encounter for preprocedural laboratory examination: Secondary | ICD-10-CM

## 2022-11-12 DIAGNOSIS — N888 Other specified noninflammatory disorders of cervix uteri: Secondary | ICD-10-CM | POA: Diagnosis not present

## 2022-11-12 DIAGNOSIS — N882 Stricture and stenosis of cervix uteri: Secondary | ICD-10-CM

## 2022-11-12 DIAGNOSIS — G8918 Other acute postprocedural pain: Secondary | ICD-10-CM

## 2022-11-12 LAB — PREGNANCY, URINE: Preg Test, Ur: NEGATIVE

## 2022-11-12 MED ORDER — IBUPROFEN 200 MG PO TABS
800.0000 mg | ORAL_TABLET | Freq: Once | ORAL | Status: AC
  Administered 2022-11-12: 800 mg via ORAL

## 2022-11-12 NOTE — Addendum Note (Signed)
Addended by: Yates Decamp on: 11/12/2022 09:45 AM   Modules accepted: Orders

## 2022-11-12 NOTE — Progress Notes (Signed)
GYNECOLOGY  VISIT   HPI: 41 y.o.   Married Other or two or more races Hispanic or Latino  female   G2P0020 with Patient's last menstrual period was 11/05/2022 (exact date).   here for evaluation of an AGUS, favor neoplastic pap, negative HPV on 10/24/22.  She has monthly cycles and hasn't been sexually active since her last cycle.   Cycles are every 27-28 days x 5 days. 2 days are heavy, she uses a large bad and changes it 2-3 x a day. No intermenstrual bleeding. Cycles have been heavier with clots in the last few years. She has severe cramps.  Hgb from 10/24/22 was 10.8 gm/dl.  GYNECOLOGIC HISTORY: Patient's last menstrual period was 11/05/2022 (exact date). 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 Georgette Dover, MD;  Location: [facility];  Service: General;  Laterality: N/A;   DIAGNOSTIC LAPAROSCOPY WITH REMOVAL OF ECTOPIC PREGNANCY N/A 03/04/2016   Procedure: DIAGNOSTIC LAPAROSCOPY WITH REMOVAL OF ECTOPIC PREGNANCY;  Surgeon: Donnamae Jude, MD;  Location: [facility];  Service: Gynecology;  Laterality: N/A;   DILATION AND CURETTAGE OF  UTERUS N/A 09/02/2015   Procedure: Suction DILATATION AND CURETTAGE;  Surgeon: Waymon Amato, MD;  Location: [facility];  Service: Gynecology;  Laterality: N/A;   PELVIC LAPAROSCOPY     cystectomy    Current Outpatient Medications  Medication Sig Dispense Refill   Ascorbic Acid (LIQUID C 500) 500 MG/15ML LIQD See admin instructions.     Calcium-Vitamin D-Vitamin K (CALCIUM+MENAQ7) 914-7829-56 MG-UNT-MCG TABS 1 tablet     cyanocobalamin 100 MCG tablet See admin instructions.     Ferrous Sulfate (IRON PO) Take by mouth.     Multiple Vitamin (MULTIVITAMIN) capsule Take 1 capsule by mouth daily.     No current facility-administered medications for this visit.     ALLERGIES: Patient has no known allergies.  Family History  Problem Relation Age of Onset   Stroke Mother    Seizures Mother    Hypertension Mother    Heart disease Mother    Diabetes Father    Heart disease Father        has had two heart attacks    Social History   Socioeconomic History   Marital status: Married    Spouse name: Not on file   Number of children: Not on file   Years  of education: Not on file   Highest education level: Not on file  Occupational History   Not on file  Tobacco Use   Smoking status: Former    Packs/day: 0.25    Years: 2.00    Total pack years: 0.50    Types: Cigarettes    Quit date: 01/08/1992    Years since quitting: 30.8   Smokeless tobacco: Never   Tobacco comments:    social smoker   Vaping Use   Vaping Use: Never used  Substance and Sexual Activity   Alcohol use: No   Drug use: No   Sexual activity: Yes    Birth control/protection: None  Other Topics Concern   Not on file  Social History Narrative   Not on file   Social Determinants of Health   Financial Resource Strain: Not on file  Food Insecurity: Not on file  Transportation Needs: Not on file  Physical Activity: Not on file  Stress: Not on file  Social Connections: Not on file  Intimate Partner Violence: Not on  file    Review of Systems  All other systems reviewed and are negative.   PHYSICAL EXAMINATION:    BP 122/64   Pulse 72   Wt 186 lb (84.4 kg)   LMP 11/05/2022 (Exact Date)   SpO2 99%   BMI 33.37 kg/m     General appearance: alert, cooperative and appears stated age  Pelvic: External genitalia:  no lesions              Urethra:  normal appearing urethra with no masses, tenderness or lesions              Bartholins and Skenes: normal                 Vagina: normal appearing vagina with normal color and discharge, no lesions              Cervix: no lesions and stenotic  Colposcopy consent reviewed and signed Colposcopy: unsatisfactory, no aceto-white changes, negative lugols examination of the cervix and upper vagina. Cervix stenotic, unable to perform ECC. Cervix cleansed with betadine and endometrial biopsy and ECC were performed (see below).  The risks of endometrial biopsy were reviewed and a consent was obtained.  A speculum was placed in the vagina and the cervix was cleansed with betadine. A tenaculum was placed on the cervix and the cervix was dilated with the mini-dilators up to a #3 hagar dilator. The uterine evacuator was placed into the endometrial cavity. The uterus sounded to 8-9 cm. The endometrial biopsy was performed, taking care to get a representative sample, sampling 360 degrees of the uterine cavity. Moderate tissue was obtained. The ECC was obtained (the endometrial biopsy was performed first while we found a 13m endocervical curette. The tenaculum and speculum were removed. There were no complications.                 Chaperone was present for exam.  1. 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 Cervical stenosis (uterine cervix) Needed cervical dilation   3. Pre-procedure lab exam - Pregnancy, urine  CC: TMarny Lowenstein NP

## 2022-11-13 LAB — SURGICAL PATHOLOGY

## 2022-11-22 ENCOUNTER — Telehealth: Payer: Self-pay | Admitting: *Deleted

## 2022-11-22 NOTE — Telephone Encounter (Signed)
Spoke with patient. Patient request to proceed with surgery with Dr. Dellis Filbert, reviewed dates, will proceed with scheduling for 12/26/22. Pre-op scheduled for 12/13/22 at 1630. Patient aware benefits will be called after 12/11/22. Questions answered.    Dr. Dellis Filbert -please send surgery request  Cc: Dr. Talbert Nan

## 2022-11-22 NOTE — Telephone Encounter (Signed)
-----   Message from Salvadore Dom, MD sent at 11/14/2022  5:18 PM EST ----- I spoke with the patient and reviewed her pathology. Given her Pap with AGUS favor neoplasm, I explained that the recommendation with negative initial evaluation is further evaluation. She needs to get set up for a cold knife cone and hysteroscopy, D&C. I explained that I won't be doing surgery as of January and that this would be set up with one of my colleagues.  I told her you would call her to arrange this. Thanks!

## 2022-11-22 NOTE — Telephone Encounter (Signed)
Surgery request sent.

## 2022-11-22 NOTE — Telephone Encounter (Signed)
Becky Bruins, MD  You45 minutes ago (12:45 PM)    Surgery: Cold knife cone and Hysteroscopy, Dilation and Curettage  Diagnosis: AGUS favor Neoplasia, Discordant Colposcopy with Benign Endometrial biopsy and Endocervical curettage  Location: [facility]  Status: Outpatient  Time: 42 Minutes  Assistant: N/A  Urgency: First Available  Pre-Op Appointment: To Be Scheduled  Post-Op Appointment(s): 2 Weeks  Time Out Of Work: Day Of Surgery ONLY

## 2022-11-27 NOTE — Telephone Encounter (Signed)
Spoke with patient. Surgery date request confirmed.  Advised surgery is scheduled for 12/26/22, [facility] 0830. Surgery instruction sheet and hospital brochure reviewed, printed copy will be mailed.  Patient verbalizes understanding and is agreeable.   Patient aware she will be contacted after 12/11/22 for benefits.  Routing to provider. Encounter closed.

## 2022-12-13 ENCOUNTER — Ambulatory Visit (INDEPENDENT_AMBULATORY_CARE_PROVIDER_SITE_OTHER): Payer: BC Managed Care – PPO | Admitting: Obstetrics & Gynecology

## 2022-12-13 ENCOUNTER — Encounter: Payer: Self-pay | Admitting: Obstetrics & Gynecology

## 2022-12-13 VITALS — BP 118/80 | HR 102 | Ht 61.75 in | Wt 188.0 lb

## 2022-12-13 DIAGNOSIS — R87619 Unspecified abnormal cytological findings in specimens from cervix uteri: Secondary | ICD-10-CM | POA: Diagnosis not present

## 2022-12-13 NOTE — Progress Notes (Signed)
    Becky Everett 1981/04/08 [id number]        41 y.o.  G2P0020   RP: Preop HSC/Myosure/D+C/Cold knife cervical Conization 12/26/2022  HPI: Pap AGUS, favors neoplastic 10/24/2022.  HPV HR Negative.  Colposcopy 11/12/2022 Negative, therefore discordant with Pap results.  ECC and EBx done, both benign as below. Given the discordance, decision to proceed with a HSC/Myosure/D+C/Cold knife cervical Conization 12/26/2022.    FINAL MICROSCOPIC DIAGNOSIS:   A. ENDOMETRIUM, BIOPSY:  - Benign proliferative endometrium  - Negative for hyperplasia or malignancy   B. ENDOCERVIX, CURETTAGE:  - Fragments of benign endocervical epithelium with mucohemorrhagic  debris   OB History  Gravida Para Term Preterm AB Living  2       2    SAB IAB Ectopic Multiple Live Births  1   1        # Outcome Date GA Lbr Len/2nd Weight Sex Delivery Anes PTL Lv  2 Ectopic           1 SAB             Past medical history,surgical history, problem list, medications, allergies, family history and social history were all reviewed and documented in the EPIC chart.   Directed ROS with pertinent positives and negatives documented in the history of present illness/assessment and plan.  Exam:  Vitals:   12/13/22 1622  BP: 118/80  Pulse: (!) 102  SpO2: 99%  Weight: 188 lb (85.3 kg)  Height: 5' 1.75" (1.568 m)   General appearance:  Normal  Gynecologic exam: Deferred   Assessment/Plan:  42 y.o. G2P0020   1. 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  HPV HR Negative.  Colposcopy 11/12/2022 Negative, therefore discordant with Pap results.  ECC and EBx done, both benign as below. Given the discordance, decision to proceed with a HSC/Myosure/D+C/Cold knife cervical Conization 12/26/2022.  Preop preparation, surgery/Risks-Benefits reviewed, postop expectations and precautions discussed.  Patient voiced understanding and agreement with the  plan.  FINAL MICROSCOPIC DIAGNOSIS:   A. ENDOMETRIUM, BIOPSY:  - Benign proliferative endometrium  - Negative for hyperplasia or malignancy   B. ENDOCERVIX, CURETTAGE:  - Fragments of benign endocervical epithelium with mucohemorrhagic  debris   Other orders - Calcium Citrate-Vitamin D (CALCIUM + D PO); Take by mouth. - Ascorbic Acid (VITAMIN C PO); Take by mouth. - Cyanocobalamin (B-12 COMPLIANCE INJECTION IJ); Inject as directed.                          Patient was counseled as to the risk of surgery to include the following:  1. Infection (prohylactic antibiotics will be administered)  2. DVT/Pulmonary Embolism (prophylactic pneumo compression stockings will be used)  3.Trauma to internal organs requiring additional surgical procedure to repair any injury to internal organs requiring perhaps additional hospitalization days.  4.Hemmorhage requiring transfusion and blood products which carry risks such as   anaphylactic reaction, hepatitis and AIDS  Patient had received literature information on the procedure scheduled and all her questions were answered and fully accepts all risk.  Princess Bruins MD, 4:37 PM 12/13/2022

## 2022-12-14 ENCOUNTER — Encounter: Payer: Self-pay | Admitting: Obstetrics & Gynecology

## 2022-12-24 ENCOUNTER — Encounter (HOSPITAL_BASED_OUTPATIENT_CLINIC_OR_DEPARTMENT_OTHER): Payer: Self-pay | Admitting: Obstetrics & Gynecology

## 2022-12-24 NOTE — Progress Notes (Addendum)
Spoke w/ via phone for pre-op interview--- [place]---- CBC per surgeon and UPT              Lab results------ COVID test -----patient states asymptomatic no test needed Arrive at -------0630 NPO after MN NO Solid Food.   Med rec completed Medications to take morning of surgery -----NONE Diabetic medication ----- Patient instructed no nail polish to be worn day of surgery Patient instructed to bring photo id and insurance card day of surgery Patient aware to have Driver (ride ) / caregiver  Husband Becky Everett  for 24 hours after surgery  Patient Special Instructions ----- Pre-Op special Istructions ----- Patient verbalized understanding of instructions that were given at this phone interview. Patient denies shortness of breath, chest pain, fever, cough at this phone interview.

## 2022-12-25 ENCOUNTER — Telehealth: Payer: Self-pay | Admitting: *Deleted

## 2022-12-25 MED ORDER — SULFAMETHOXAZOLE-TRIMETHOPRIM 800-160 MG PO TABS: 1.0000 | ORAL_TABLET | Freq: Two times a day (BID) | ORAL | 0 refills | Status: AC

## 2022-12-25 NOTE — Telephone Encounter (Signed)
Spoke with patient.  Patient reports symptoms of external vaginal itching, clear vaginal d/c with odor, urinary frequency and discomfort with voiding. Symptoms started 1/13 -1/14. Took OTC "UTI" test from the pharmacy, "positive" both times. Patient denies fever/chills, N/V, flank pain.   Patient is scheduled for cold knife cone and hysteroscopy D&C 12/26/22 at 0830.   Advised patient I will review with Dr. Dellis Filbert and return call, patient agreeable.   Reviewed with Dr. Dellis Filbert. Bactrim DS; BID x 3 days.  Take one tablet now PO, second tablet tonight at HS. Take one tablet in the morning with minimal water.  Dispense #6 tab/ 0RF.   NKDA.   Spoke with patient, advised per Dr. Dellis Filbert. Rx sent to verified pharmacy. Patient verbalizes understanding and is agreeable.   Routing to provider for final review. Will close encounter.

## 2022-12-26 ENCOUNTER — Encounter (HOSPITAL_BASED_OUTPATIENT_CLINIC_OR_DEPARTMENT_OTHER)
Admission: RE | Disposition: A | Discharge status: Home / Self Care | Payer: Self-pay | Source: Home / Self Care | Attending: Obstetrics & Gynecology

## 2022-12-26 ENCOUNTER — Ambulatory Visit (HOSPITAL_BASED_OUTPATIENT_CLINIC_OR_DEPARTMENT_OTHER): Payer: BC Managed Care – PPO | Admitting: Anesthesiology

## 2022-12-26 ENCOUNTER — Ambulatory Visit (HOSPITAL_BASED_OUTPATIENT_CLINIC_OR_DEPARTMENT_OTHER)
Admission: RE | Admit: 2022-12-26 | Discharge: 2022-12-26 | Disposition: A | Discharge status: Home / Self Care | Payer: BC Managed Care – PPO | Attending: Obstetrics & Gynecology | Admitting: Obstetrics & Gynecology

## 2022-12-26 ENCOUNTER — Other Ambulatory Visit: Payer: Self-pay

## 2022-12-26 ENCOUNTER — Encounter (HOSPITAL_BASED_OUTPATIENT_CLINIC_OR_DEPARTMENT_OTHER): Payer: Self-pay | Admitting: Obstetrics & Gynecology

## 2022-12-26 DIAGNOSIS — Z6833 Body mass index (BMI) 33.0-33.9, adult: Secondary | ICD-10-CM | POA: Insufficient documentation

## 2022-12-26 DIAGNOSIS — E669 Obesity, unspecified: Secondary | ICD-10-CM | POA: Insufficient documentation

## 2022-12-26 DIAGNOSIS — Z87891 Personal history of nicotine dependence: Secondary | ICD-10-CM | POA: Insufficient documentation

## 2022-12-26 DIAGNOSIS — R87619 Unspecified abnormal cytological findings in specimens from cervix uteri: Secondary | ICD-10-CM | POA: Diagnosis not present

## 2022-12-26 DIAGNOSIS — Z01818 Encounter for other preprocedural examination: Secondary | ICD-10-CM

## 2022-12-26 DIAGNOSIS — N72 Inflammatory disease of cervix uteri: Secondary | ICD-10-CM | POA: Insufficient documentation

## 2022-12-26 DIAGNOSIS — N840 Polyp of corpus uteri: Secondary | ICD-10-CM | POA: Diagnosis not present

## 2022-12-26 DIAGNOSIS — N879 Dysplasia of cervix uteri, unspecified: Secondary | ICD-10-CM | POA: Diagnosis not present

## 2022-12-26 HISTORY — PX: DILATATION & CURETTAGE/HYSTEROSCOPY WITH MYOSURE: SHX6511

## 2022-12-26 HISTORY — PX: CERVICAL CONIZATION W/BX: SHX1330

## 2022-12-26 LAB — CBC
HCT: 36.8 % (ref 36.0–46.0)
Hemoglobin: 11.3 g/dL — ABNORMAL LOW (ref 12.0–15.0)
MCH: 24.9 pg — ABNORMAL LOW (ref 26.0–34.0)
MCHC: 30.7 g/dL (ref 30.0–36.0)
MCV: 81.2 fL (ref 80.0–100.0)
Platelets: 397 10*3/uL (ref 150–400)
RBC: 4.53 MIL/uL (ref 3.87–5.11)
RDW: 15.2 % (ref 11.5–15.5)
WBC: 9.1 10*3/uL (ref 4.0–10.5)
nRBC: 0 % (ref 0.0–0.2)

## 2022-12-26 LAB — POCT PREGNANCY, URINE: Preg Test, Ur: NEGATIVE

## 2022-12-26 SURGERY — CONE BIOPSY, CERVIX
Anesthesia: General | Site: Vagina

## 2022-12-26 MED ORDER — CEFAZOLIN SODIUM-DEXTROSE 2-4 GM/100ML-% IV SOLN
2.0000 g | INTRAVENOUS | Status: AC
  Administered 2022-12-26: 2 g via INTRAVENOUS

## 2022-12-26 MED ORDER — MIDAZOLAM HCL 2 MG/2ML IJ SOLN
INTRAMUSCULAR | Status: DC | PRN
  Administered 2022-12-26: 2 mg via INTRAVENOUS

## 2022-12-26 MED ORDER — PROMETHAZINE HCL 25 MG/ML IJ SOLN: 6.2500 mg | INTRAMUSCULAR | Status: DC | PRN

## 2022-12-26 MED ORDER — ACETAMINOPHEN 500 MG PO TABS
ORAL_TABLET | ORAL | Status: AC
  Filled 2022-12-26: qty 2

## 2022-12-26 MED ORDER — LACTATED RINGERS IV SOLN
INTRAVENOUS | Status: DC
Start: 2022-12-26 — End: 2022-12-26

## 2022-12-26 MED ORDER — CEFAZOLIN SODIUM-DEXTROSE 2-4 GM/100ML-% IV SOLN
INTRAVENOUS | Status: AC
  Filled 2022-12-26: qty 100

## 2022-12-26 MED ORDER — FENTANYL CITRATE (PF) 100 MCG/2ML IJ SOLN: 25.0000 ug | INTRAMUSCULAR | Status: DC | PRN

## 2022-12-26 MED ORDER — FENTANYL CITRATE (PF) 100 MCG/2ML IJ SOLN
INTRAMUSCULAR | Status: AC
  Filled 2022-12-26: qty 2

## 2022-12-26 MED ORDER — ACETAMINOPHEN 500 MG PO TABS
1000.0000 mg | ORAL_TABLET | Freq: Once | ORAL | Status: AC
  Administered 2022-12-26: 1000 mg via ORAL

## 2022-12-26 MED ORDER — ACETIC ACID 4% SOLUTION
Status: DC | PRN
  Administered 2022-12-26: 1 via TOPICAL

## 2022-12-26 MED ORDER — POVIDONE-IODINE 10 % EX SWAB: 2.0000 | Freq: Once | CUTANEOUS | Status: DC

## 2022-12-26 MED ORDER — DEXAMETHASONE SODIUM PHOSPHATE 4 MG/ML IJ SOLN
INTRAMUSCULAR | Status: DC | PRN
  Administered 2022-12-26: 8 mg via INTRAVENOUS

## 2022-12-26 MED ORDER — MIDAZOLAM HCL 2 MG/2ML IJ SOLN
INTRAMUSCULAR | Status: AC
  Filled 2022-12-26: qty 2

## 2022-12-26 MED ORDER — LIDOCAINE-EPINEPHRINE (PF) 1 %-1:200000 IJ SOLN
INTRAMUSCULAR | Status: DC | PRN
  Administered 2022-12-26: 10 mL

## 2022-12-26 MED ORDER — ONDANSETRON HCL 4 MG/2ML IJ SOLN
INTRAMUSCULAR | Status: DC | PRN
  Administered 2022-12-26: 4 mg via INTRAVENOUS

## 2022-12-26 MED ORDER — LACTATED RINGERS IV SOLN: INTRAVENOUS | Status: DC

## 2022-12-26 MED ORDER — SODIUM CHLORIDE 0.9 % IR SOLN
Status: DC | PRN
  Administered 2022-12-26: 1000 mL

## 2022-12-26 MED ORDER — LIDOCAINE HCL (CARDIAC) PF 100 MG/5ML IV SOSY
PREFILLED_SYRINGE | INTRAVENOUS | Status: DC | PRN
  Administered 2022-12-26: 50 mg via INTRAVENOUS

## 2022-12-26 MED ORDER — PROPOFOL 10 MG/ML IV BOLUS
INTRAVENOUS | Status: DC | PRN
  Administered 2022-12-26: 200 mg via INTRAVENOUS

## 2022-12-26 MED ORDER — FENTANYL CITRATE (PF) 100 MCG/2ML IJ SOLN
INTRAMUSCULAR | Status: DC | PRN
  Administered 2022-12-26 (×2): 25 ug via INTRAVENOUS
  Administered 2022-12-26: 50 ug via INTRAVENOUS

## 2022-12-26 SURGICAL SUPPLY — 40 items
APL SWBSTK 6 STRL LF DISP (MISCELLANEOUS)
APPLICATOR COTTON TIP 6 STRL (MISCELLANEOUS) ×1 IMPLANT
APPLICATOR COTTON TIP 6IN STRL (MISCELLANEOUS)
BLADE SURG 11 STRL SS (BLADE) ×1 IMPLANT
CATH ROBINSON RED A/P 16FR (CATHETERS) ×1 IMPLANT
COVER BACK TABLE 60X90IN (DRAPES) ×1 IMPLANT
DEVICE MYOSURE LITE (MISCELLANEOUS) IMPLANT
DEVICE MYOSURE REACH (MISCELLANEOUS) IMPLANT
DILATOR CANAL MILEX (MISCELLANEOUS) IMPLANT
DRSG TELFA 3X8 NADH STRL (GAUZE/BANDAGES/DRESSINGS) ×1 IMPLANT
ELECT BALL LEEP 3MM BLK (ELECTRODE) IMPLANT
ELECT BALL LEEP 5MM RED (ELECTRODE) IMPLANT
ELECT REM PT RETURN 9FT ADLT (ELECTROSURGICAL) ×1
ELECTRODE REM PT RTRN 9FT ADLT (ELECTROSURGICAL) IMPLANT
GAUZE 4X4 16PLY ~~LOC~~+RFID DBL (SPONGE) ×2 IMPLANT
GLOVE BIO SURGEON STRL SZ 6.5 (GLOVE) ×1 IMPLANT
GLOVE BIOGEL PI IND STRL 7.0 (GLOVE) ×2 IMPLANT
GOWN STRL REUS W/TWL LRG LVL3 (GOWN DISPOSABLE) ×2 IMPLANT
IV NS IRRIG 3000ML ARTHROMATIC (IV SOLUTION) ×1 IMPLANT
KIT PROCEDURE FLUENT (KITS) ×1 IMPLANT
KIT TURNOVER CYSTO (KITS) ×1 IMPLANT
MANIFOLD NEPTUNE II (INSTRUMENTS) IMPLANT
MYOSURE XL FIBROID (MISCELLANEOUS)
NS IRRIG 500ML POUR BTL (IV SOLUTION) IMPLANT
PACK VAGINAL MINOR WOMEN LF (CUSTOM PROCEDURE TRAY) ×1 IMPLANT
PACK VAGINAL WOMENS (CUSTOM PROCEDURE TRAY) ×1 IMPLANT
PAD OB MATERNITY 4.3X12.25 (PERSONAL CARE ITEMS) ×1 IMPLANT
PAD PREP 24X48 CUFFED NSTRL (MISCELLANEOUS) ×1 IMPLANT
SCOPETTES 8  STERILE (MISCELLANEOUS)
SCOPETTES 8 STERILE (MISCELLANEOUS) ×2 IMPLANT
SEAL CERVICAL OMNI LOK (ABLATOR) IMPLANT
SEAL ROD LENS SCOPE MYOSURE (ABLATOR) ×1 IMPLANT
SOL PREP POV-IOD 4OZ 10% (MISCELLANEOUS) IMPLANT
SPONGE SURGIFOAM ABS GEL 12-7 (HEMOSTASIS) IMPLANT
SUT CHROMIC 0 CT 1 (SUTURE) IMPLANT
SUT VIC AB 0 CT1 36 (SUTURE) ×1 IMPLANT
SYSTEM TISS REMOVAL MYOSURE XL (MISCELLANEOUS) IMPLANT
TOWEL OR 17X26 10 PK STRL BLUE (TOWEL DISPOSABLE) ×2 IMPLANT
VACUUM HOSE 7/8X10 W/ WAND (MISCELLANEOUS) ×1 IMPLANT
WATER STERILE IRR 500ML POUR (IV SOLUTION) ×1 IMPLANT

## 2022-12-26 NOTE — Anesthesia Postprocedure Evaluation (Signed)
Anesthesia Post Note  Patient: Restaurant manager, [employer] food  Procedure(s) Performed: CONIZATION CERVIX WITH BIOPSY (Vagina ) DILATATION & CURETTAGE/HYSTEROSCOPY WITH MYOSURE (Vagina )     Patient location during evaluation: PACU Anesthesia Type: General Level of consciousness: awake and alert Pain management: pain level controlled Vital Signs Assessment: post-procedure vital signs reviewed and stable Respiratory status: spontaneous breathing, nonlabored ventilation, respiratory function stable and patient connected to nasal cannula oxygen Cardiovascular status: blood pressure returned to baseline and stable Postop Assessment: no apparent nausea or vomiting Anesthetic complications: no   No notable events documented.  Last Vitals:  Vitals:   12/26/22 1000 12/26/22 1030  BP: 116/76 107/75  Pulse: 90 91  Resp: 12 14  Temp:  36.4 C  SpO2: 99% 100%    Last Pain:  Vitals:   12/26/22 1030  TempSrc:   PainSc: [place]

## 2022-12-26 NOTE — Anesthesia Procedure Notes (Signed)
Procedure Name: LMA Insertion Date/Time: 12/26/2022 8:32 AM  Performed by: Georgeanne Nim, CRNAPre-anesthesia Checklist: Patient identified, Emergency Drugs available, Suction available, Patient being monitored and Timeout performed Patient Re-evaluated:Patient Re-evaluated prior to induction Oxygen Delivery Method: Circle system utilized Preoxygenation: Pre-oxygenation with 100% oxygen Induction Type: IV induction Ventilation: Mask ventilation without difficulty LMA: LMA inserted LMA Size: 4.0 Number of attempts: 1 Placement Confirmation: positive ETCO2 and breath sounds checked- equal and bilateral Tube secured with: Tape Dental Injury: Teeth and Oropharynx as per pre-operative assessment

## 2022-12-26 NOTE — Op Note (Signed)
Operative Note  12/26/2022  9:32 AM  PATIENT:  Becky Everett  42 y.o. female  PRE-OPERATIVE DIAGNOSIS:  AGUS favor neoplasia, discordant colposcopy with benign endometrial biopsy and endocervical curettage  POST-OPERATIVE DIAGNOSIS:  AGUS favor neoplasia, discordant colposcopy with benign endometrial biopsy and endocervical curettage  PROCEDURE:  Procedure(s): DILATATION & CURETTAGE OF THE ENDOMETRIUM AND ENDOCERVIX/HYSTEROSCOPY WITH MYOSURE EXCISIONS. COLD KNIFE CONIZATION CERVIX  SURGEON:  Surgeon(s): Princess Bruins, MD  ANESTHESIA:   general  FINDINGS: Endometrial Polyps with thickened Endometrium.  Both Ostia well seen.  DESCRIPTION OF OPERATION: Under general anesthesia with laryngeal mask, the patient is in lithotomy position.  She is prepped with Betadine on the suprapubic, vulvar and vaginal areas.  The bladder is catheterized.  The patient is draped as usual.  Patient received a dose of Ancef IV.  Timeout is done.  The vaginal exam reveals an anteverted uterus, normal volume and mobile.  No adnexal mass.  The speculum is inserted in the vagina and the anterior lip of the cervix is grasped with a tenaculum. Dilation of the cervix with Pratt dilators up to #19 without difficulty.  Insertion of the hysteroscope in the intra uterine cavity.  Good visualization of the cavity with both ostia well seen.  The endometrial lining is thickened with small polyps anteriorly.  We therefore inserted the MyoSure reach and excised all the areas of thickened endometrium and the polyps.  Pictures were taken before the excisions and after.  Hemostasis is adequate.  The hysteroscope with MyoSure are removed.  We then proceeded with an endocervical curettage, with a rectangular sharp curette, which is sent to pathology separately.  An endometrial curettage was done with a sharp curette on all surfaces and sent to pathology together with the excision material.  We then applied 2 tenaculums on the  cervix 1 anteriorly and 1 posteriorly.  Vinegar was applied on the cervix which showed a light acetowhite area at 6:00.  We infiltrated lidocaine 1% with epinephrine all around the exocervix.  We then used a blade #11 knife to proceed with the cervical conization.  We switched to an angled knife with a #15 blade to finish to conization at the endocervical end.  The specimen was sent to pathology.  We then used a medium size ball at 60 coag for hemostasis on the conization bed.  We added a figure-of-eight with Vicryl 0 at 6:00 and 12:00 for hemostasis.  Hemostasis was adequate at all levels.  The speculum was removed.  The patient was brought to recovery room in good and stable status.  ESTIMATED BLOOD LOSS: 10 mL FLUID DEFICIT: 30 mL  Intake/Output Summary (Last 24 hours) at 12/26/2022 0932 Last data filed at 12/26/2022 0919 Gross per 24 hour  Intake 400 ml  Output 10 ml  Net 390 ml     BLOOD ADMINISTERED:none   LOCAL MEDICATIONS USED:  LIDOCAINE WITH EPINEPHRINE 15 cc at the Cervix  SPECIMEN:  Source of Specimen:  Endocervical curettings, Endometrial curettings with Endometrial excision material, Cervical Cone.  DISPOSITION OF SPECIMEN:  PATHOLOGY  COUNTS:  YES  PLAN OF CARE: Transfer to PACU  Marie-Lyne LavoieMD9:32 AM

## 2022-12-26 NOTE — H&P (Signed)
Becky Everett is an 42 y.o. female. [id number]  RP: HSC/Myosure/D+C/Cold knife cervical Conization    HPI: Pap AGUS, favors neoplastic 10/24/2022.  HPV HR Negative.  Colposcopy 11/12/2022 Negative, therefore discordant with Pap results.  ECC and EBx done, both benign as below. Given the discordance, decision to proceed with a HSC/Myosure/D+C/Cold knife cervical Conization 12/26/2022.    Pertinent Gynecological History: Menses: flow is moderate Blood transfusions: none Last pap: AGUS favors neoplastic  Menstrual History: Patient's last menstrual period was 12/01/2022 (exact date).    Past Medical History:  Diagnosis Date   Arthralgia of temporomandibular joint    Gallstones    Seasonal allergic rhinitis 08/09/2020    Past Surgical History:  Procedure Laterality Date   CHOLECYSTECTOMY  03/05/2012   Procedure: LAPAROSCOPIC CHOLECYSTECTOMY WITH INTRAOPERATIVE CHOLANGIOGRAM;  Surgeon: Imogene Burn. Georgette Dover, MD;  Location: [facility];  Service: General;  Laterality: N/A;   DIAGNOSTIC LAPAROSCOPY WITH REMOVAL OF ECTOPIC PREGNANCY N/A 03/04/2016   Procedure: DIAGNOSTIC LAPAROSCOPY WITH REMOVAL OF ECTOPIC PREGNANCY;  Surgeon: Donnamae Jude, MD;  Location: [facility];  Service: Gynecology;  Laterality: N/A;   DILATION AND CURETTAGE OF UTERUS N/A 09/02/2015   Procedure: Suction DILATATION AND CURETTAGE;  Surgeon: Waymon Amato, MD;  Location: [facility];  Service: Gynecology;  Laterality: N/A;   GASTRIC BYPASS  2019   PELVIC LAPAROSCOPY     cystectomy    Family History  Problem Relation Age of Onset   Stroke Mother    Seizures Mother    Hypertension Mother    Heart disease Mother    Diabetes Father    Heart disease Father        has had two heart attacks    Social History:  reports that she quit smoking about 30 years ago. Her smoking use included cigarettes. She has a 0.50 pack-year smoking history. She has never used smokeless tobacco. She reports that she does not drink alcohol and does not use  drugs.  Allergies: No Known Allergies  Medications Prior to Admission  Medication Sig Dispense Refill Last Dose   acetaminophen (TYLENOL) 325 MG tablet Take 650 mg by mouth every 6 (six) hours as needed for moderate pain or mild pain.   Past Month   Ascorbic Acid (VITAMIN C PO) Take by mouth.   Past Week   Calcium Citrate-Vitamin D (CALCIUM + D PO) Take by mouth.   Past Week   Ferrous Sulfate (IRON PO) Take by mouth.   12/25/2022   Multiple Vitamin (MULTIVITAMIN) capsule Take 1 capsule by mouth daily.   Past Week   sulfamethoxazole-trimethoprim (BACTRIM DS) 800-160 MG tablet Take 1 tablet by mouth 2 (two) times daily for 3 days. 6 tablet 0 12/25/2022   Cyanocobalamin (B-12 COMPLIANCE INJECTION IJ) Inject as directed.   More than a month    REVIEW OF SYSTEMS: A ROS was performed and pertinent positives and negatives are included in the history. GENERAL: No fevers or chills. HEENT: No change in vision, no earache, sore throat or sinus congestion. NECK: No pain or stiffness. CARDIOVASCULAR: No chest pain or pressure. No palpitations. PULMONARY: No shortness of breath, cough or wheeze. GASTROINTESTINAL: No abdominal pain, nausea, vomiting or diarrhea, melena or bright red blood per rectum. GENITOURINARY: No urinary frequency, urgency, hesitancy or dysuria. MUSCULOSKELETAL: No joint or muscle pain, no back pain, no recent trauma. DERMATOLOGIC: No rash, no itching, no lesions. ENDOCRINE: No polyuria, polydipsia, no heat or cold intolerance. No recent change in weight. HEMATOLOGICAL: No anemia or easy bruising or  bleeding. NEUROLOGIC: No headache, seizures, numbness, tingling or weakness. PSYCHIATRIC: No depression, no loss of interest in normal activity or change in sleep pattern.     Blood pressure 119/72, pulse (!) 103, temperature 98.2 F (36.8 C), temperature source Oral, resp. rate 16, height '5\' 2"'$  (1.575 m), weight 84.1 kg, last menstrual period 12/01/2022, SpO2 97 %.  Physical Exam:  Results  for orders placed or performed during the hospital encounter of 12/26/22 (from the past 24 hour(s))  Pregnancy, urine POC     Status: None   Collection Time: 12/26/22  6:48 AM  Result Value Ref Range   Preg Test, Ur NEGATIVE NEGATIVE  CBC     Status: Abnormal   Collection Time: 12/26/22  7:15 AM  Result Value Ref Range   WBC 9.1 4.0 - 10.5 K/uL   RBC 4.53 3.87 - 5.11 MIL/uL   Hemoglobin 11.3 (L) 12.0 - 15.0 g/dL   HCT 36.8 36.0 - 46.0 %   MCV 81.2 80.0 - 100.0 fL   MCH 24.9 (L) 26.0 - 34.0 pg   MCHC 30.7 30.0 - 36.0 g/dL   RDW 15.2 11.5 - 15.5 %   Platelets 397 150 - 400 K/uL   nRBC 0.0 0.0 - 0.2 %   Gynecologic exam: Deferred     Assessment/Plan:  42 y.o. G2P0020    1. 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  HPV HR Negative.  Colposcopy 11/12/2022 Negative, therefore discordant with Pap results.  ECC and EBx done, both benign as below. Given the discordance, decision to proceed with a HSC/Myosure/D+C/Cold knife cervical Conization 12/26/2022.  Preop preparation, surgery/Risks-Benefits reviewed, postop expectations and precautions discussed.  Patient voiced understanding and agreement with the plan.   FINAL MICROSCOPIC DIAGNOSIS:   A. ENDOMETRIUM, BIOPSY:  - Benign proliferative endometrium  - Negative for hyperplasia or malignancy   B. ENDOCERVIX, CURETTAGE:  - Fragments of benign endocervical epithelium with mucohemorrhagic  debris    Other orders - Calcium Citrate-Vitamin D (CALCIUM + D PO); Take by mouth. - Ascorbic Acid (VITAMIN C PO); Take by mouth. - Cyanocobalamin (B-12 COMPLIANCE INJECTION IJ); Inject as directed.                            Patient was counseled as to the risk of surgery to include the following:   1. Infection (prohylactic antibiotics will be administered)   2. DVT/Pulmonary Embolism (prophylactic pneumo compression stockings will be used)   3.Trauma to  internal organs requiring additional surgical procedure to repair any injury to internal organs requiring perhaps additional hospitalization days.   4.Hemmorhage requiring transfusion and blood products which carry risks such as   anaphylactic reaction, hepatitis and AIDS   Patient had received literature information on the procedure scheduled and all her questions were answered and fully accepts all risk. Marie-Lyne Shenequa Howse 12/26/2022, 7:47 AM

## 2022-12-26 NOTE — Anesthesia Preprocedure Evaluation (Addendum)
Anesthesia Evaluation  Patient identified by MRN, date of birth, ID band Patient awake    Reviewed: Allergy & Precautions, NPO status , Patient's Chart, lab work & pertinent test results  Airway Mallampati: II  TM Distance: >3 FB Neck ROM: Full    Dental  (+) Teeth Intact, Dental Advisory Given, Caps,    Pulmonary former smoker   Pulmonary exam normal breath sounds clear to auscultation       Cardiovascular negative cardio ROS Normal cardiovascular exam Rhythm:Regular Rate:Normal     Neuro/Psych negative neurological ROS     GI/Hepatic negative GI ROS, Neg liver ROS,,,  Endo/Other  negative endocrine ROS  Obesity   Renal/GU negative Renal ROS     Musculoskeletal negative musculoskeletal ROS (+)    Abdominal   Peds  Hematology  (+) Blood dyscrasia, anemia   Anesthesia Other Findings Day of surgery medications reviewed with the patient.  Reproductive/Obstetrics AGUS favor neoplasia, discordant colposcopy with benign endometrial biopsy and endocervical curettage                             Anesthesia Physical Anesthesia Plan  ASA: 2  Anesthesia Plan: General   Post-op Pain Management: Tylenol PO (pre-op)* and Toradol IV (intra-op)*   Induction: Intravenous  PONV Risk Score and Plan: 4 or greater and Midazolam, Dexamethasone and Ondansetron  Airway Management Planned: LMA  Additional Equipment:   Intra-op Plan:   Post-operative Plan: Extubation in OR  Informed Consent: I have reviewed the patients History and Physical, chart, labs and discussed the procedure including the risks, benefits and alternatives for the proposed anesthesia with the patient or authorized representative who has indicated his/her understanding and acceptance.     Dental advisory given  Plan Discussed with: CRNA  Anesthesia Plan Comments:        Anesthesia Quick Evaluation

## 2022-12-26 NOTE — Discharge Instructions (Signed)
     No acetaminophen/Tylenol until after 1:00 pm today if needed.   Post Anesthesia Home Care Instructions  Activity: Get plenty of rest for the remainder of the day. A responsible individual must stay with you for 24 hours following the procedure.  For the next 24 hours, DO NOT: -Drive a car -Paediatric nurse -Drink alcoholic beverages -Take any medication unless instructed by your physician -Make any legal decisions or sign important papers.  Meals: Start with liquid foods such as gelatin or soup. Progress to regular foods as tolerated. Avoid greasy, spicy, heavy foods. If nausea and/or vomiting occur, drink only clear liquids until the nausea and/or vomiting subsides. Call your physician if vomiting continues.  Special Instructions/Symptoms: Your throat may feel dry or sore from the anesthesia or the breathing tube placed in your throat during surgery. If this causes discomfort, gargle with warm salt water. The discomfort should disappear within 24 hours.

## 2022-12-26 NOTE — Transfer of Care (Signed)
Immediate Anesthesia Transfer of Care Note  Patient: [place] Helderman  Procedure(s) Performed: CONIZATION CERVIX WITH BIOPSY (Vagina ) DILATATION & CURETTAGE/HYSTEROSCOPY WITH MYOSURE (Vagina )  Patient Location: PACU  Anesthesia Type:General  Level of Consciousness: awake and patient cooperative  Airway & Oxygen Therapy: Patient Spontanous Breathing and Patient connected to nasal cannula oxygen  Post-op Assessment: Report given to RN and Post -op Vital signs reviewed and stable  Post vital signs: Reviewed and stable  Last Vitals:  Vitals Value Taken Time  BP 125/70 12/26/22 0930  Temp 36.6 C 12/26/22 0930  Pulse 96 12/26/22 0932  Resp 17 12/26/22 0932  SpO2 100 % 12/26/22 0932  Vitals shown include unvalidated device data.  Last Pain:  Vitals:   12/26/22 0701  TempSrc: Oral  PainSc: 0-No pain      Patients Stated Pain Goal: 6 (16/10/96 0454)  Complications: No notable events documented.

## 2022-12-27 ENCOUNTER — Encounter (HOSPITAL_BASED_OUTPATIENT_CLINIC_OR_DEPARTMENT_OTHER): Payer: Self-pay | Admitting: Obstetrics & Gynecology

## 2022-12-28 LAB — SURGICAL PATHOLOGY

## 2023-01-09 ENCOUNTER — Encounter: Payer: Self-pay | Admitting: Obstetrics & Gynecology

## 2023-01-09 ENCOUNTER — Ambulatory Visit (INDEPENDENT_AMBULATORY_CARE_PROVIDER_SITE_OTHER): Payer: BC Managed Care – PPO | Admitting: Obstetrics & Gynecology

## 2023-01-09 VITALS — BP 110/80 | HR 92

## 2023-01-09 DIAGNOSIS — Z09 Encounter for follow-up examination after completed treatment for conditions other than malignant neoplasm: Secondary | ICD-10-CM

## 2023-01-09 NOTE — Progress Notes (Signed)
    Becky Everett 12-19-1980 [id number]        41 y.o.  G2P0020   RP: Post op 12-26-22 D&C of the endometrium & endocervix/hysteroscopy with myosure excisions. Cold knife conization of cervix  HPI: Doing very well.  No vaginal bleeding.  No abdominopelvic pain.  Vaginal discharge, but no odor or itching.  Urine/BMs normal.   OB History  Gravida Para Term Preterm AB Living  2       2    SAB IAB Ectopic Multiple Live Births  1   1        # Outcome Date GA Lbr Len/2nd Weight Sex Delivery Anes PTL Lv  2 Ectopic           1 SAB             Past medical history,surgical history, problem list, medications, allergies, family history and social history were all reviewed and documented in the EPIC chart.   Directed ROS with pertinent positives and negatives documented in the history of present illness/assessment and plan.  Exam:  Vitals:   01/09/23 0917  BP: 110/80  Pulse: 92  SpO2: 97%   General appearance:  Normal  Abdomen: Normal  Gynecologic exam: Vulva normal.  Speculum:  Cervix healing well from Conization, stitches still present.  Normal discharge.  No blood.  Bimanual exam: Uterus AV, normal volume, mobile, NT.  No adnexal mass, NT.  Patho 12/26/22: FINAL MICROSCOPIC DIAGNOSIS:   A. ENDOCERVIX, CURETTAGE:  Benign endocervical mucosa and abundant blood.  Negative for dysplasia or malignancy.   B. ENDOMETRIUM, CURETTAGE:  Proliferative endometrium.  Negative for atypia, hyperplasia or malignancy.   C. CERVIX, CONE:  Slight cervicitis and immature squamous metaplasia.  Negative for dysplasia or malignancy.   COMMENT:  The recent Pap smear with atypical glandular cells (MCG [phone]) is  noted.  Immature squamous metaplasia can mimic atypia on Pap smear.    Assessment/Plan:  42 y.o. G2P0020   1. Status post gynecological surgery, follow-up exam  Doing very well.  No vaginal bleeding.  No abdominopelvic pain.  Vaginal discharge, but no odor or itching.   Urine/BMs normal. Gyn exam today confirming a normal healing process with no Cx.  Patho completely benign, reviewed with patient and reassurance given.  Will repeat Pap test with next Annual Gyn exam in 10/2023 and then per recommendations.  Post op precautions reviewed.  Recommend waiting another 2 weeks before resuming sexual activities with IC.  Princess Bruins MD, 9:35 AM 01/09/2023

## 2023-06-03 ENCOUNTER — Other Ambulatory Visit: Payer: Self-pay | Admitting: Nurse Practitioner

## 2023-06-03 DIAGNOSIS — Z1231 Encounter for screening mammogram for malignant neoplasm of breast: Secondary | ICD-10-CM

## 2023-07-15 ENCOUNTER — Ambulatory Visit
Admission: RE | Admit: 2023-07-15 | Discharge: 2023-07-15 | Disposition: A | Discharge status: Home / Self Care | Payer: 59 | Source: Ambulatory Visit | Attending: Nurse Practitioner | Admitting: Nurse Practitioner

## 2023-07-15 DIAGNOSIS — Z1231 Encounter for screening mammogram for malignant neoplasm of breast: Secondary | ICD-10-CM

## 2023-10-28 ENCOUNTER — Ambulatory Visit: Payer: BC Managed Care – PPO | Admitting: Nurse Practitioner

## 2023-10-31 ENCOUNTER — Ambulatory Visit: Payer: BC Managed Care – PPO | Admitting: Obstetrics & Gynecology

## 2023-10-31 ENCOUNTER — Ambulatory Visit: Payer: BC Managed Care – PPO | Admitting: Obstetrics and Gynecology

## 2023-11-05 ENCOUNTER — Ambulatory Visit: Payer: 59 | Admitting: Obstetrics and Gynecology

## 2023-12-23 ENCOUNTER — Ambulatory Visit: Payer: 59 | Admitting: Obstetrics and Gynecology

## 2024-01-24 ENCOUNTER — Encounter: Payer: Self-pay | Admitting: Obstetrics and Gynecology

## 2024-01-24 ENCOUNTER — Ambulatory Visit: Payer: 59 | Admitting: Obstetrics and Gynecology

## 2024-01-24 ENCOUNTER — Other Ambulatory Visit (HOSPITAL_COMMUNITY)
Admission: RE | Admit: 2024-01-24 | Discharge: 2024-01-24 | Disposition: A | Discharge status: Home / Self Care | Payer: 59 | Source: Ambulatory Visit | Attending: Obstetrics and Gynecology | Admitting: Obstetrics and Gynecology

## 2024-01-24 VITALS — BP 122/68 | HR 94 | Temp 98.3°F | Ht 62.75 in | Wt 193.0 lb

## 2024-01-24 DIAGNOSIS — Z124 Encounter for screening for malignant neoplasm of cervix: Secondary | ICD-10-CM

## 2024-01-24 DIAGNOSIS — N898 Other specified noninflammatory disorders of vagina: Secondary | ICD-10-CM | POA: Diagnosis not present

## 2024-01-24 DIAGNOSIS — N920 Excessive and frequent menstruation with regular cycle: Secondary | ICD-10-CM

## 2024-01-24 DIAGNOSIS — Z6834 Body mass index (BMI) 34.0-34.9, adult: Secondary | ICD-10-CM | POA: Insufficient documentation

## 2024-01-24 DIAGNOSIS — Z01419 Encounter for gynecological examination (general) (routine) without abnormal findings: Secondary | ICD-10-CM | POA: Insufficient documentation

## 2024-01-24 DIAGNOSIS — Z23 Encounter for immunization: Secondary | ICD-10-CM

## 2024-01-24 DIAGNOSIS — R7303 Prediabetes: Secondary | ICD-10-CM

## 2024-01-24 DIAGNOSIS — E7889 Other lipoprotein metabolism disorders: Secondary | ICD-10-CM

## 2024-01-24 DIAGNOSIS — Z1331 Encounter for screening for depression: Secondary | ICD-10-CM

## 2024-01-24 DIAGNOSIS — Z8742 Personal history of other diseases of the female genital tract: Secondary | ICD-10-CM | POA: Diagnosis present

## 2024-01-24 DIAGNOSIS — Z9189 Other specified personal risk factors, not elsewhere classified: Secondary | ICD-10-CM

## 2024-01-24 DIAGNOSIS — E785 Hyperlipidemia, unspecified: Secondary | ICD-10-CM

## 2024-01-24 LAB — WET PREP FOR TRICH, YEAST, CLUE

## 2024-01-24 NOTE — Progress Notes (Signed)
43 y.o. G77P0020 female s/p CKC and D&C (2024, benign pathology), prior ectopic pregnancy (2017 s/p LSO), history of infertility, gastric sleeve (2019), menorrhagia with anemia here for annual exam. Married.  Works in [employer], [employer].  Noticing a vaginal odor after intercourse.  No itching or irritation otherwise. Notes heavy vaginal bleeding on cycle day 2-3 with larger clots.  Has a history of heavy periods. PHQ-9: 16, reports most of her symptoms are related to work stressors.  Has good support and her spouse.  Is also looking forward to visiting her family in [place].  No active suicidal ideation.  Declines referral to psychiatry.  Patient's last menstrual period was 01/18/2024 (approximate). Period Duration (Days): 5 Period Pattern: Regular Menstrual Flow: Heavy (first two days) Menstrual Control: Maxi pad Dysmenorrhea: (!) Severe Dysmenorrhea Symptoms: Diarrhea, Headache, Nausea  Abnormal bleeding: As noted above Pelvic discharge or pain: None Breast mass, nipple discharge or skin changes : None Birth control: None Gardasil: never Last PAP:     Component Value Date/Time   DIAGPAP - Atypical glandular cells, favor neoplastic (A) 10/24/2022 0853   HPVHIGH Negative 10/24/2022 0853   ADEQPAP  10/24/2022 0853    Satisfactory for evaluation; transformation zone component PRESENT.   Last mammogram: 07/15/2023 BI-RADS 1, density C Last colonoscopy: Never Sexually active: Yes Exercising: Yes, walking, not as often as she does in the warmer months Smoker: Former  GYN HISTORY: Infertility Prior ectopic pregnancy, 2017 Atypical glandular cells on Pap, 2023 Cold knife cone, D&C, 2024  OB History  Gravida Para Term Preterm AB Living  2    2   SAB IAB Ectopic Multiple Live Births  1  1      # Outcome Date GA Lbr Len/2nd Weight Sex Type Anes PTL Lv  2 Ectopic           1 SAB             Past Medical History:  Diagnosis Date   Arthralgia of temporomandibular joint     Gallstones    Seasonal allergic rhinitis 08/09/2020    Past Surgical History:  Procedure Laterality Date   CERVICAL CONIZATION W/BX N/A 12/26/2022   Procedure: CONIZATION CERVIX WITH BIOPSY;  Surgeon: Genia Del, MD;  Location: [facility];  Service: Gynecology;  Laterality: N/A;   CHOLECYSTECTOMY  03/05/2012   Procedure: LAPAROSCOPIC CHOLECYSTECTOMY WITH INTRAOPERATIVE CHOLANGIOGRAM;  Surgeon: Wilmon Arms. Corliss Skains, MD;  Location: [facility];  Service: General;  Laterality: N/A;   DIAGNOSTIC LAPAROSCOPY WITH REMOVAL OF ECTOPIC PREGNANCY N/A 03/04/2016   Procedure: DIAGNOSTIC LAPAROSCOPY WITH REMOVAL OF ECTOPIC PREGNANCY;  Surgeon: Reva Bores, MD;  Location: [facility];  Service: Gynecology;  Laterality: N/A;   DILATATION & CURETTAGE/HYSTEROSCOPY WITH MYOSURE N/A 12/26/2022   Procedure: DILATATION & CURETTAGE/HYSTEROSCOPY WITH MYOSURE;  Surgeon: Genia Del, MD;  Location: [facility];  Service: Gynecology;  Laterality: N/A;   DILATION AND CURETTAGE OF UTERUS N/A 09/02/2015   Procedure: Suction DILATATION AND CURETTAGE;  Surgeon: Hoover Browns, MD;  Location: [facility];  Service: Gynecology;  Laterality: N/A;   GASTRIC BYPASS  2019   PELVIC LAPAROSCOPY     cystectomy    Current Outpatient Medications on File Prior to Visit  Medication Sig Dispense Refill   Ascorbic Acid (VITAMIN C PO) Take by mouth.     Calcium Citrate-Vitamin D (CALCIUM + D PO) Take by mouth.     Cyanocobalamin (B-12 COMPLIANCE INJECTION IJ) Inject as directed.     Ferrous Sulfate (  IRON PO) Take by mouth.     fluticasone (FLONASE) 50 MCG/ACT nasal spray as needed.     Multiple Vitamin (MULTIVITAMIN) capsule Take 1 capsule by mouth daily.     No current facility-administered medications on file prior to visit.    Social History   Socioeconomic History   Marital status: Married    Spouse name: Not on file   Number of children: Not on file   Years of education: Not on file   Highest  education level: Not on file  Occupational History   Not on file  Tobacco Use   Smoking status: Former    Current packs/day: 0.00    Average packs/day: 0.3 packs/day for 2.0 years (0.5 ttl pk-yrs)    Types: Cigarettes    Start date: 01/07/1990    Quit date: 01/08/1992    Years since quitting: 32.0   Smokeless tobacco: Never   Tobacco comments:    social smoker in the past  Vaping Use   Vaping status: Never Used  Substance and Sexual Activity   Alcohol use: No   Drug use: No   Sexual activity: Yes    Partners: Male  Other Topics Concern   Not on file  Social History Narrative   Not on file   Social Drivers of Health   Financial Resource Strain: Not on file  Food Insecurity: Not on file  Transportation Needs: Not on file  Physical Activity: Not on file  Stress: Not on file  Social Connections: Not on file  Intimate Partner Violence: Not on file    Family History  Problem Relation Age of Onset   Stroke Mother    Seizures Mother    Hypertension Mother    Heart disease Mother    Diabetes Father    Heart disease Father        has had two heart attacks    No Known Allergies    PE Today's Vitals   01/24/24 0816  BP: 122/68  Pulse: 94  Temp: 98.3 F (36.8 C)  TempSrc: Oral  SpO2: 99%  Weight: 193 lb (87.5 kg)  Height: 5' 2.75" (1.594 m)   Body mass index is 34.46 kg/m.  Physical Exam Vitals reviewed. Exam conducted with a chaperone present.  Constitutional:      General: She is not in acute distress.    Appearance: Normal appearance.  HENT:     Head: Normocephalic and atraumatic.     Nose: Nose normal.  Eyes:     Extraocular Movements: Extraocular movements intact.     Conjunctiva/sclera: Conjunctivae normal.  Neck:     Thyroid: No thyroid mass, thyromegaly or thyroid tenderness.  Pulmonary:     Effort: Pulmonary effort is normal.  Chest:     Chest wall: No mass or tenderness.  Breasts:    Right: Normal. No swelling, mass, nipple discharge,  skin change or tenderness.     Left: Normal. No swelling, mass, nipple discharge, skin change or tenderness.  Abdominal:     General: There is no distension.     Palpations: Abdomen is soft.     Tenderness: There is no abdominal tenderness.  Genitourinary:    General: Normal vulva.     Exam position: Lithotomy position.     Urethra: No prolapse.     Vagina: Normal. No vaginal discharge or bleeding.     Cervix: Normal. No lesion.     Uterus: Normal. Not enlarged and not tender.      Adnexa:  Right adnexa normal and left adnexa normal.  Musculoskeletal:        General: Normal range of motion.     Cervical back: Normal range of motion.  Lymphadenopathy:     Upper Body:     Right upper body: No axillary adenopathy.     Left upper body: No axillary adenopathy.     Lower Body: No right inguinal adenopathy. No left inguinal adenopathy.  Skin:    General: Skin is warm and dry.  Neurological:     General: No focal deficit present.     Mental Status: She is alert.  Psychiatric:        Mood and Affect: Mood normal.        Behavior: Behavior normal.      Assessment and Plan:        Well woman exam with routine gynecological exam Assessment & Plan: Cervical cancer screening performed according to ASCCP guidelines. Encouraged annual mammogram screening Labs and immunizations with her primary Encouraged safe sexual practices as indicated Encouraged healthy lifestyle practices with diet and exercise For patients under 50yo, I recommend 1000mg  calcium daily and 600IU of vitamin D daily.   Orders: -     VITAMIN D 25 Hydroxy (Vit-D Deficiency, Fractures) -     Thyroid Panel With TSH -     CBC -     COMPLETE METABOLIC PANEL WITH GFR  History of abnormal cervical Pap smear -     Cytology - PAP  Cervical cancer screening -     Cytology - PAP  Vaginal discharge -     WET PREP FOR TRICH, YEAST, CLUE  Hyperlipidemia, unspecified hyperlipidemia type -     Lipid panel  BMI  34.0-34.9,adult Assessment & Plan: Encouraged healthy diet and exercise.  Orders: -     Hemoglobin A1C w/out eAG -     Lipid panel  Menorrhagia with regular cycle Assessment & Plan: Will complete hormonal work-up, [place] and EMB if >45yo or risk factors for endometrial hyperplasia or malignancy. RTO for [place]. Will discuss further management at that time. Has not had prior discussion about treatment options for menorrhagia.    Orders: -     Testos,Total,Free and SHBG (Female) -     US PELVIS TRANSVAGINAL NON-OB (TV ONLY); Future  Need for HPV vaccination -     HPV 9-valent vaccine,Recombinat  At risk for depression  Has good support and her spouse.  No active suicidal ideation.  Declines referral for counseling.  Recommend improving diet and increasing activity for mental health.  Rosalyn Gess, MD

## 2024-01-24 NOTE — Assessment & Plan Note (Signed)
Will complete hormonal work-up, [place] and EMB if >45yo or risk factors for endometrial hyperplasia or malignancy. RTO for [place]. Will discuss further management at that time. Has not had prior discussion about treatment options for menorrhagia.

## 2024-01-24 NOTE — Assessment & Plan Note (Signed)
Cervical cancer screening performed according to ASCCP guidelines. Encouraged annual mammogram screening Labs and immunizations with her primary Encouraged safe sexual practices as indicated Encouraged healthy lifestyle practices with diet and exercise For patients under 45yo, I recommend 1000mg  calcium daily and 600IU of vitamin D daily.

## 2024-01-24 NOTE — Patient Instructions (Addendum)
Managing stress Get 6-8 hours of sleep every night. To improve your sleep, make sure you: Keep your bedroom dark and cool. Go to sleep and wake up at about the same time every day. Limit screen time starting a few hours before bedtime. Exercise regularly. Avoid making major decisions until you feel better. Practice techniques to manage your stress, such as: Mindfulness-based meditation. Focused breathing exercises. Relaxation exercises. Journaling. Find activities that help you relax, like reading, getting a massage, or spending time with a friend. Do mind-body exercises, like yoga or tai chi.  Health Maintenance, Female Adopting a healthy lifestyle and getting preventive care are important in promoting health and wellness. Ask your health care provider about: The right schedule for you to have regular tests and exams. Things you can do on your own to prevent diseases and keep yourself healthy. What should I know about diet, weight, and exercise? Eat a healthy diet  Eat a diet that includes plenty of vegetables, fruits, low-fat dairy products, and lean protein. Do not eat a lot of foods that are high in solid fats, added sugars, or sodium. Maintain a healthy weight Body mass index (BMI) is used to identify weight problems. It estimates body fat based on height and weight. Your health care provider can help determine your BMI and help you achieve or maintain a healthy weight. Get regular exercise Get regular exercise. This is one of the most important things you can do for your health. Most adults should: Exercise for at least 150 minutes each week. The exercise should increase your heart rate and make you sweat (moderate-intensity exercise). Do strengthening exercises at least twice a week. This is in addition to the moderate-intensity exercise. Spend less time sitting. Even light physical activity can be beneficial. Watch cholesterol and blood lipids Have your blood tested for lipids  and cholesterol at 45 years of age, then have this test every 5 years. Have your cholesterol levels checked more often if: Your lipid or cholesterol levels are high. You are older than 45 years of age. You are at high risk for heart disease. What should I know about cancer screening? Depending on your health history and family history, you may need to have cancer screening at various ages. This may include screening for: Breast cancer. Cervical cancer. Colorectal cancer. Skin cancer. Lung cancer. What should I know about heart disease, diabetes, and high blood pressure? Blood pressure and heart disease High blood pressure causes heart disease and increases the risk of stroke. This is more likely to develop in people who have high blood pressure readings or are overweight. Have your blood pressure checked: Every 3-5 years if you are 50-59 years of age. Every year if you are 45 years old or older. Diabetes Have regular diabetes screenings. This checks your fasting blood sugar level. Have the screening done: Once every three years after age 76 if you are at a normal weight and have a low risk for diabetes. More often and at a younger age if you are overweight or have a high risk for diabetes. What should I know about preventing infection? Hepatitis B If you have a higher risk for hepatitis B, you should be screened for this virus. Talk with your health care provider to find out if you are at risk for hepatitis B infection. Hepatitis C Testing is recommended for: Everyone born from [birth year] through 1967. Anyone with known risk factors for hepatitis C. Sexually transmitted infections (STIs) Get screened for STIs, including gonorrhea and  chlamydia, if: You are sexually active and are younger than 45 years of age. You are older than 45 years of age and your health care provider tells you that you are at risk for this type of infection. Your sexual activity has changed since you were last  screened, and you are at increased risk for chlamydia or gonorrhea. Ask your health care provider if you are at risk. Ask your health care provider about whether you are at high risk for HIV. Your health care provider may recommend a prescription medicine to help prevent HIV infection. If you choose to take medicine to prevent HIV, you should first get tested for HIV. You should then be tested every 3 months for as long as you are taking the medicine. Pregnancy If you are about to stop having your period (premenopausal) and you may become pregnant, seek counseling before you get pregnant. Take 400 to 800 micrograms (mcg) of folic acid every day if you become pregnant. Ask for birth control (contraception) if you want to prevent pregnancy. Osteoporosis and menopause Osteoporosis is a disease in which the bones lose minerals and strength with aging. This can result in bone fractures. If you are 61 years old or older, or if you are at risk for osteoporosis and fractures, ask your health care provider if you should: Be screened for bone loss. Take a calcium or vitamin D supplement to lower your risk of fractures. Be given hormone replacement therapy (HRT) to treat symptoms of menopause. Follow these instructions at home: Alcohol use Do not drink alcohol if: Your health care provider tells you not to drink. You are pregnant, may be pregnant, or are planning to become pregnant. If you drink alcohol: Limit how much you have to: 0-1 drink a day. Know how much alcohol is in your drink. In the [place] one drink equals one 12 oz bottle of beer (355 mL), one 5 oz glass of wine (148 mL), or one 1 oz glass of hard liquor (44 mL). Lifestyle Do not use any products that contain nicotine or tobacco. These products include cigarettes, chewing tobacco, and vaping devices, such as e-cigarettes. If you need help quitting, ask your health care provider. Do not use street drugs. Do not share needles. Ask your health  care provider for help if you need support or information about quitting drugs. General instructions Schedule regular health, dental, and eye exams. Stay current with your vaccines. Tell your health care provider if: You often feel depressed. You have ever been abused or do not feel safe at home. Summary Adopting a healthy lifestyle and getting preventive care are important in promoting health and wellness. Follow your health care provider's instructions about healthy diet, exercising, and getting tested or screened for diseases. Follow your health care provider's instructions on monitoring your cholesterol and blood pressure. This information is not intended to replace advice given to you by your health care provider. Make sure you discuss any questions you have with your health care provider. Document Revised: 04/17/2021 Document Reviewed: 04/17/2021 Elsevier Patient Education  2024 [employer].

## 2024-01-24 NOTE — Assessment & Plan Note (Deleted)
Cervical cancer screening performed according to ASCCP guidelines. Encouraged annual mammogram screening Labs and immunizations with her primary Encouraged safe sexual practices as indicated Encouraged healthy lifestyle practices with diet and exercise For patients under 45yo, I recommend 1000mg  calcium daily and 600IU of vitamin D daily.

## 2024-01-24 NOTE — Assessment & Plan Note (Signed)
Encouraged healthy diet and exercise.

## 2024-01-27 ENCOUNTER — Encounter: Payer: Self-pay | Admitting: Obstetrics and Gynecology

## 2024-01-27 DIAGNOSIS — R7303 Prediabetes: Secondary | ICD-10-CM | POA: Insufficient documentation

## 2024-01-27 DIAGNOSIS — E7889 Other lipoprotein metabolism disorders: Secondary | ICD-10-CM | POA: Insufficient documentation

## 2024-01-27 NOTE — Addendum Note (Signed)
 Addended by: Darrell Jewel V on: 01/27/2024 10:26 AM   Modules accepted: Orders

## 2024-01-30 LAB — CBC
HCT: 31.3 % — ABNORMAL LOW (ref 35.0–45.0)
Hemoglobin: 9.7 g/dL — ABNORMAL LOW (ref 11.7–15.5)
MCH: 24.3 pg — ABNORMAL LOW (ref 27.0–33.0)
MCHC: 31 g/dL — ABNORMAL LOW (ref 32.0–36.0)
MCV: 78.4 fL — ABNORMAL LOW (ref 80.0–100.0)
MPV: 11.3 fL (ref 7.5–12.5)
Platelets: 436 10*3/uL — ABNORMAL HIGH (ref 140–400)
RBC: 3.99 10*6/uL (ref 3.80–5.10)
RDW: 14.7 % (ref 11.0–15.0)
WBC: 6.3 10*3/uL (ref 3.8–10.8)

## 2024-01-30 LAB — LIPID PANEL
Cholesterol: 214 mg/dL — ABNORMAL HIGH (ref <200)
HDL: 41 mg/dL — ABNORMAL LOW (ref >=50)
LDL Cholesterol (Calc): 129 mg/dL — ABNORMAL HIGH
Non-HDL Cholesterol (Calc): 173 mg/dL — ABNORMAL HIGH (ref <130)
Total CHOL/HDL Ratio: 5.2 (calc) — ABNORMAL HIGH (ref <5.0)
Triglycerides: 284 mg/dL — ABNORMAL HIGH (ref <150)

## 2024-01-30 LAB — CYTOLOGY - PAP
Comment: NEGATIVE
Diagnosis: NEGATIVE
Diagnosis: REACTIVE
High risk HPV: NEGATIVE

## 2024-01-30 LAB — VITAMIN D 25 HYDROXY (VIT D DEFICIENCY, FRACTURES): Vit D, 25-Hydroxy: 22 ng/mL — ABNORMAL LOW (ref 30–100)

## 2024-01-30 LAB — TESTOS,TOTAL,FREE AND SHBG (FEMALE)
Free Testosterone: 1.5 pg/mL (ref 0.1–6.4)
Sex Hormone Binding: 49.2 nmol/L (ref 17–124)
Testosterone, Total, LC-MS-MS: 13 ng/dL (ref 2–45)

## 2024-01-30 LAB — COMPLETE METABOLIC PANEL WITH GFR
AG Ratio: 2 (calc) (ref 1.0–2.5)
ALT: 16 U/L (ref 6–29)
AST: 18 U/L (ref 10–30)
Albumin: 4.5 g/dL (ref 3.6–5.1)
Alkaline phosphatase (APISO): 71 U/L (ref 31–125)
BUN: 11 mg/dL (ref 7–25)
CO2: 25 mmol/L (ref 20–32)
Calcium: 9.2 mg/dL (ref 8.6–10.2)
Chloride: 105 mmol/L (ref 98–110)
Creat: 0.55 mg/dL (ref 0.50–0.99)
Globulin: 2.3 g/dL (ref 1.9–3.7)
Glucose, Bld: 86 mg/dL (ref 65–99)
Potassium: 4.5 mmol/L (ref 3.5–5.3)
Sodium: 139 mmol/L (ref 135–146)
Total Bilirubin: 0.2 mg/dL (ref 0.2–1.2)
Total Protein: 6.8 g/dL (ref 6.1–8.1)
eGFR: 116 mL/min/{1.73_m2} (ref >=60)

## 2024-01-30 LAB — THYROID PANEL WITH TSH
Free Thyroxine Index: 2.6 (ref 1.4–3.8)
T3 Uptake: 29 % (ref 22–35)
T4, Total: 8.8 ug/dL (ref 5.1–11.9)
TSH: 1.33 m[IU]/L

## 2024-01-30 LAB — HEMOGLOBIN A1C W/OUT EAG: Hgb A1c MFr Bld: 5.8 %{Hb} — ABNORMAL HIGH (ref <5.7)

## 2024-02-07 NOTE — Telephone Encounter (Signed)
 Msg sent to appt desk.

## 2024-02-10 NOTE — Telephone Encounter (Signed)
 Becky Everett, CMA Left message instructing patient to contact insurance company to check if it pays for weight loss medication. Patent to call back if she wants to schedule.

## 2024-02-19 ENCOUNTER — Other Ambulatory Visit: Payer: 59 | Admitting: Obstetrics and Gynecology

## 2024-02-19 ENCOUNTER — Other Ambulatory Visit: Payer: 59

## 2024-02-25 ENCOUNTER — Ambulatory Visit: Admitting: Radiology

## 2024-03-12 ENCOUNTER — Ambulatory Visit: Admitting: Radiology

## 2024-03-17 ENCOUNTER — Other Ambulatory Visit: Admitting: Obstetrics and Gynecology

## 2024-03-17 ENCOUNTER — Other Ambulatory Visit

## 2024-03-24 ENCOUNTER — Ambulatory Visit: Payer: 59

## 2024-03-24 DIAGNOSIS — Z23 Encounter for immunization: Secondary | ICD-10-CM

## 2024-06-29 ENCOUNTER — Other Ambulatory Visit: Payer: Self-pay | Admitting: Family Medicine

## 2024-06-29 DIAGNOSIS — Z1231 Encounter for screening mammogram for malignant neoplasm of breast: Secondary | ICD-10-CM

## 2024-07-21 ENCOUNTER — Ambulatory Visit
Admission: RE | Admit: 2024-07-21 | Discharge: 2024-07-21 | Disposition: A | Discharge status: Home / Self Care | Source: Ambulatory Visit | Attending: Family Medicine | Admitting: Family Medicine

## 2024-07-21 ENCOUNTER — Ambulatory Visit

## 2024-07-21 DIAGNOSIS — Z1231 Encounter for screening mammogram for malignant neoplasm of breast: Secondary | ICD-10-CM

## 2024-07-27 ENCOUNTER — Ambulatory Visit: Payer: Self-pay

## 2024-07-28 ENCOUNTER — Ambulatory Visit (INDEPENDENT_AMBULATORY_CARE_PROVIDER_SITE_OTHER)

## 2024-07-28 DIAGNOSIS — Z23 Encounter for immunization: Secondary | ICD-10-CM | POA: Diagnosis not present

## 2024-08-26 DIAGNOSIS — E538 Deficiency of other specified B group vitamins: Secondary | ICD-10-CM | POA: Diagnosis not present

## 2024-08-26 DIAGNOSIS — Z23 Encounter for immunization: Secondary | ICD-10-CM | POA: Diagnosis not present

## 2024-08-26 DIAGNOSIS — E559 Vitamin D deficiency, unspecified: Secondary | ICD-10-CM | POA: Diagnosis not present

## 2024-08-26 DIAGNOSIS — D509 Iron deficiency anemia, unspecified: Secondary | ICD-10-CM | POA: Diagnosis not present

## 2024-08-26 DIAGNOSIS — Z0000 Encounter for general adult medical examination without abnormal findings: Secondary | ICD-10-CM | POA: Diagnosis not present

## 2024-08-26 DIAGNOSIS — E7800 Pure hypercholesterolemia, unspecified: Secondary | ICD-10-CM | POA: Diagnosis not present

## 2024-09-10 ENCOUNTER — Other Ambulatory Visit (HOSPITAL_COMMUNITY): Payer: Self-pay | Admitting: Pharmacy Technician

## 2024-09-10 ENCOUNTER — Telehealth (HOSPITAL_COMMUNITY): Payer: Self-pay

## 2024-09-10 DIAGNOSIS — D509 Iron deficiency anemia, unspecified: Secondary | ICD-10-CM | POA: Insufficient documentation

## 2024-09-10 NOTE — Telephone Encounter (Addendum)
 Auth Submission: NO AUTH NEEDED Site of care: [facility] Payer: [employer] [place] Medication & CPT/J Code(s) submitted: Feraheme (ferumoxytol) [id number] Diagnosis Code: D50.9 Route of submission (phone, fax, portal):  Phone # Fax # Auth type: Buy/Bill HB Units/visits requested: 510mg  x 2 doses Reference number:  Approval from: 09/10/24 to 12/09/24

## 2024-09-15 DIAGNOSIS — R051 Acute cough: Secondary | ICD-10-CM | POA: Diagnosis not present

## 2024-09-15 DIAGNOSIS — J029 Acute pharyngitis, unspecified: Secondary | ICD-10-CM | POA: Diagnosis not present

## 2024-09-23 ENCOUNTER — Inpatient Hospital Stay (HOSPITAL_COMMUNITY): Admission: RE | Admit: 2024-09-23 | Source: Ambulatory Visit

## 2024-09-23 ENCOUNTER — Ambulatory Visit (HOSPITAL_COMMUNITY)
Admission: RE | Admit: 2024-09-23 | Discharge: 2024-09-23 | Disposition: A | Discharge status: Home / Self Care | Source: Ambulatory Visit | Attending: Internal Medicine | Admitting: Internal Medicine

## 2024-09-23 VITALS — BP 127/81 | HR 86 | Temp 97.8°F | Resp 18

## 2024-09-23 DIAGNOSIS — D509 Iron deficiency anemia, unspecified: Secondary | ICD-10-CM | POA: Insufficient documentation

## 2024-09-23 MED ORDER — SODIUM CHLORIDE 0.9 % IV SOLN
510.0000 mg | Freq: Once | INTRAVENOUS | Status: AC
  Administered 2024-09-23: 510 mg via INTRAVENOUS
  Filled 2024-09-23: qty 17

## 2024-09-23 MED ORDER — SODIUM CHLORIDE 0.9 % IV SOLN: INTRAVENOUS | Status: DC | PRN

## 2024-09-23 NOTE — Progress Notes (Signed)
 Diagnosis: iron deficiency anemia    Provider: Charmaine Bright PA-C    Procedure:Feraheme 510 mg IV    Note: pt received Feraheme 510 IV, no pretreatment given.  Vital signs stable.  Patient declined AVS she uses mychart .  Patient ambulatory upon discharge and is directed to stop by check out desk to schedule next infusion for next week.

## 2024-09-30 ENCOUNTER — Ambulatory Visit (HOSPITAL_COMMUNITY)
Admission: RE | Admit: 2024-09-30 | Discharge: 2024-09-30 | Disposition: A | Discharge status: Home / Self Care | Source: Ambulatory Visit | Attending: Internal Medicine | Admitting: Internal Medicine

## 2024-09-30 VITALS — BP 115/60 | HR 96 | Temp 98.3°F | Resp 16

## 2024-09-30 DIAGNOSIS — D509 Iron deficiency anemia, unspecified: Secondary | ICD-10-CM | POA: Diagnosis not present

## 2024-09-30 MED ORDER — SODIUM CHLORIDE 0.9 % IV SOLN: INTRAVENOUS | Status: DC | PRN

## 2024-09-30 MED ORDER — SODIUM CHLORIDE 0.9 % IV SOLN
510.0000 mg | Freq: Once | INTRAVENOUS | Status: AC
  Administered 2024-09-30: 510 mg via INTRAVENOUS
  Filled 2024-09-30: qty 17

## 2024-09-30 NOTE — Progress Notes (Signed)
 PATIENT CARE CENTER NOTE:  Diagnosis: iron deficiency anemia unspecified type  Provider: Charmaine Bright PA  Procedure:  Feraheme 510 mg infusion   Patient received IV Feraheme ( dose #2 of 2). Observed for at least 30 minutes post infusion. Tolerated well, no adverse reaction noted, vitals stable, discharge instructions given , verbalized understanding. Pt provided with work excuse at pt request. Patient alert, oriented, and ambulatory at the time of discharge.
